# Patient Record
Sex: Female | Born: 1950 | Race: White | Hispanic: No | Smoking: Never smoker
Health system: Southern US, Community
[De-identification: ages and names within clinical notes are randomized; demographics above are authoritative.]

## PROBLEM LIST (undated history)

## (undated) DIAGNOSIS — M1990 Unspecified osteoarthritis, unspecified site: Secondary | ICD-10-CM

## (undated) DIAGNOSIS — I77811 Abdominal aortic ectasia: Secondary | ICD-10-CM

## (undated) DIAGNOSIS — C4490 Unspecified malignant neoplasm of skin, unspecified: Secondary | ICD-10-CM

## (undated) DIAGNOSIS — E785 Hyperlipidemia, unspecified: Secondary | ICD-10-CM

## (undated) DIAGNOSIS — D649 Anemia, unspecified: Secondary | ICD-10-CM

## (undated) DIAGNOSIS — D509 Iron deficiency anemia, unspecified: Secondary | ICD-10-CM

## (undated) DIAGNOSIS — E559 Vitamin D deficiency, unspecified: Secondary | ICD-10-CM

## (undated) DIAGNOSIS — I700 Atherosclerosis of aorta: Secondary | ICD-10-CM

## (undated) HISTORY — PX: ABDOMINAL HYSTERECTOMY: SHX81

## (undated) HISTORY — PX: RIGHT OOPHORECTOMY: SHX2359

## (undated) HISTORY — PX: TUBAL LIGATION: SHX77

## (undated) HISTORY — PX: EXPLORATORY LAPAROTOMY: SUR591

---

## 2005-05-17 ENCOUNTER — Ambulatory Visit: Payer: Self-pay | Admitting: Obstetrics and Gynecology

## 2005-06-01 ENCOUNTER — Ambulatory Visit: Payer: Self-pay | Admitting: Obstetrics and Gynecology

## 2005-07-18 ENCOUNTER — Ambulatory Visit: Payer: Self-pay | Admitting: Gynecologic Oncology

## 2005-08-08 ENCOUNTER — Ambulatory Visit: Payer: Self-pay | Admitting: Obstetrics and Gynecology

## 2005-08-17 ENCOUNTER — Ambulatory Visit: Payer: Self-pay | Admitting: Obstetrics and Gynecology

## 2005-11-12 ENCOUNTER — Ambulatory Visit: Payer: Self-pay | Admitting: Gastroenterology

## 2005-11-12 HISTORY — PX: COLONOSCOPY: SHX174

## 2006-01-22 ENCOUNTER — Ambulatory Visit: Payer: Self-pay | Admitting: Obstetrics and Gynecology

## 2007-05-15 ENCOUNTER — Ambulatory Visit: Payer: Self-pay | Admitting: Obstetrics and Gynecology

## 2008-08-24 ENCOUNTER — Ambulatory Visit: Payer: Self-pay | Admitting: Obstetrics and Gynecology

## 2009-09-01 ENCOUNTER — Ambulatory Visit: Payer: Self-pay | Admitting: Obstetrics and Gynecology

## 2013-01-27 ENCOUNTER — Ambulatory Visit: Payer: Self-pay | Admitting: Internal Medicine

## 2013-02-03 ENCOUNTER — Ambulatory Visit: Payer: Self-pay | Admitting: Internal Medicine

## 2014-01-28 ENCOUNTER — Ambulatory Visit: Payer: Self-pay | Admitting: Internal Medicine

## 2014-07-15 ENCOUNTER — Ambulatory Visit: Payer: Self-pay | Admitting: Internal Medicine

## 2015-01-07 ENCOUNTER — Other Ambulatory Visit: Payer: Self-pay | Admitting: Internal Medicine

## 2015-01-07 DIAGNOSIS — Z1231 Encounter for screening mammogram for malignant neoplasm of breast: Secondary | ICD-10-CM

## 2015-01-31 ENCOUNTER — Ambulatory Visit
Admission: RE | Admit: 2015-01-31 | Discharge: 2015-01-31 | Disposition: A | Discharge status: Home / Self Care | Payer: 59 | Source: Ambulatory Visit | Attending: Internal Medicine | Admitting: Internal Medicine

## 2015-01-31 DIAGNOSIS — Z1231 Encounter for screening mammogram for malignant neoplasm of breast: Secondary | ICD-10-CM | POA: Diagnosis not present

## 2015-01-31 HISTORY — DX: Unspecified malignant neoplasm of skin, unspecified: C44.90

## 2016-01-04 ENCOUNTER — Other Ambulatory Visit: Payer: Self-pay | Admitting: Internal Medicine

## 2016-01-04 DIAGNOSIS — Z1231 Encounter for screening mammogram for malignant neoplasm of breast: Secondary | ICD-10-CM

## 2016-02-01 ENCOUNTER — Ambulatory Visit: Payer: 59

## 2016-02-09 ENCOUNTER — Encounter: Payer: Self-pay | Admitting: *Deleted

## 2016-02-09 ENCOUNTER — Ambulatory Visit
Admission: RE | Admit: 2016-02-09 | Discharge: 2016-02-09 | Disposition: A | Discharge status: Home / Self Care | Payer: 59 | Source: Ambulatory Visit | Attending: Internal Medicine | Admitting: Internal Medicine

## 2016-02-09 DIAGNOSIS — Z1231 Encounter for screening mammogram for malignant neoplasm of breast: Secondary | ICD-10-CM | POA: Insufficient documentation

## 2016-02-10 ENCOUNTER — Ambulatory Visit: Payer: 59 | Admitting: Anesthesiology

## 2016-02-10 ENCOUNTER — Encounter: Payer: Self-pay | Admitting: *Deleted

## 2016-02-10 ENCOUNTER — Encounter
Admission: RE | Disposition: A | Discharge status: Home / Self Care | Payer: Self-pay | Source: Ambulatory Visit | Attending: Unknown Physician Specialty

## 2016-02-10 ENCOUNTER — Ambulatory Visit
Admission: RE | Admit: 2016-02-10 | Discharge: 2016-02-10 | Disposition: A | Discharge status: Home / Self Care | Payer: 59 | Source: Ambulatory Visit | Attending: Unknown Physician Specialty | Admitting: Unknown Physician Specialty

## 2016-02-10 DIAGNOSIS — Z1211 Encounter for screening for malignant neoplasm of colon: Secondary | ICD-10-CM | POA: Diagnosis present

## 2016-02-10 DIAGNOSIS — K5730 Diverticulosis of large intestine without perforation or abscess without bleeding: Secondary | ICD-10-CM | POA: Diagnosis not present

## 2016-02-10 DIAGNOSIS — K640 First degree hemorrhoids: Secondary | ICD-10-CM | POA: Insufficient documentation

## 2016-02-10 HISTORY — DX: Anemia, unspecified: D64.9

## 2016-02-10 HISTORY — PX: COLONOSCOPY WITH PROPOFOL: SHX5780

## 2016-02-10 HISTORY — DX: Vitamin D deficiency, unspecified: E55.9

## 2016-02-10 HISTORY — DX: Hyperlipidemia, unspecified: E78.5

## 2016-02-10 SURGERY — COLONOSCOPY WITH PROPOFOL
Anesthesia: General

## 2016-02-10 MED ORDER — SODIUM CHLORIDE 0.9 % IV SOLN
INTRAVENOUS | Status: DC
  Administered 2016-02-10: 1000 mL via INTRAVENOUS
  Administered 2016-02-10: 14:00:00 via INTRAVENOUS

## 2016-02-10 MED ORDER — SODIUM CHLORIDE 0.9 % IV SOLN: INTRAVENOUS | Status: DC

## 2016-02-10 MED ORDER — MIDAZOLAM HCL 2 MG/2ML IJ SOLN
INTRAMUSCULAR | Status: DC | PRN
  Administered 2016-02-10 (×2): 1 mg via INTRAVENOUS

## 2016-02-10 MED ORDER — PROPOFOL 500 MG/50ML IV EMUL
INTRAVENOUS | Status: DC | PRN
  Administered 2016-02-10: 120 ug/kg/min via INTRAVENOUS

## 2016-02-10 MED ORDER — FENTANYL CITRATE (PF) 100 MCG/2ML IJ SOLN
INTRAMUSCULAR | Status: DC | PRN
  Administered 2016-02-10: 5 ug via INTRAVENOUS
  Administered 2016-02-10: 50 ug via INTRAVENOUS
  Administered 2016-02-10: 25 ug via INTRAVENOUS

## 2016-02-10 NOTE — Anesthesia Procedure Notes (Signed)
Performed by: COOK-MARTIN, Jerris Keltz Pre-anesthesia Checklist: Patient identified, Emergency Drugs available, Suction available, Patient being monitored and Timeout performed Patient Re-evaluated:Patient Re-evaluated prior to inductionOxygen Delivery Method: Nasal cannula Preoxygenation: Pre-oxygenation with 100% oxygen Intubation Type: IV induction Placement Confirmation: positive ETCO2 and CO2 detector       

## 2016-02-10 NOTE — Anesthesia Preprocedure Evaluation (Addendum)
Anesthesia Evaluation  Patient identified by MRN, date of birth, ID band Patient awake    Reviewed: Allergy & Precautions, H&P , NPO status , Patient's Chart, lab work & pertinent test results, reviewed documented beta blocker date and time   History of Anesthesia Complications Negative for: history of anesthetic complications  Airway Mallampati: III  TM Distance: >3 FB Neck ROM: full    Dental no notable dental hx. (+) Poor Dentition   Pulmonary neg pulmonary ROS,    Pulmonary exam normal breath sounds clear to auscultation       Cardiovascular Exercise Tolerance: Good negative cardio ROS Normal cardiovascular exam Rhythm:regular Rate:Normal     Neuro/Psych negative neurological ROS  negative psych ROS   GI/Hepatic Neg liver ROS, GERD  ,  Endo/Other  negative endocrine ROS  Renal/GU negative Renal ROS  negative genitourinary   Musculoskeletal   Abdominal   Peds  Hematology  (+) Blood dyscrasia, anemia ,   Anesthesia Other Findings Past Medical History:   Skin cancer                                                  Anemia                                                       Vitamin D deficiency                                         Hyperlipidemia                                               Reproductive/Obstetrics negative OB ROS                            Anesthesia Physical Anesthesia Plan  ASA: II  Anesthesia Plan: General   Post-op Pain Management:    Induction:   Airway Management Planned:   Additional Equipment:   Intra-op Plan:   Post-operative Plan:   Informed Consent: I have reviewed the patients History and Physical, chart, labs and discussed the procedure including the risks, benefits and alternatives for the proposed anesthesia with the patient or authorized representative who has indicated his/her understanding and acceptance.   Dental Advisory  Given  Plan Discussed with: Anesthesiologist, CRNA and Surgeon  Anesthesia Plan Comments:        Anesthesia Quick Evaluation

## 2016-02-10 NOTE — Op Note (Signed)
[facility] Gastroenterology Patient Name: Gina Jordan Procedure Date: 02/10/2016 2:39 PM MRN: [id number] Account #: [id number] Date of Birth: Aug 19, 1951 Admit Type: Outpatient Age: 65 Room: [facility] ENDO ROOM 1 Gender: Female Note Status: Finalized Procedure:            Colonoscopy Indications:          Screening for colorectal malignant neoplasm Providers:            Manya Silvas, MD Referring MD:         Ramonita Lab, MD (Referring MD) Medicines:            Propofol per Anesthesia Complications:        No immediate complications. Procedure:            Pre-Anesthesia Assessment:                       - After reviewing the risks and benefits, the patient                        was deemed in satisfactory condition to undergo the                        procedure.                       After obtaining informed consent, the colonoscope was                        passed under direct vision. Throughout the procedure,                        the patient's blood pressure, pulse, and oxygen                        saturations were monitored continuously. The                        Colonoscope was introduced through the anus and                        advanced to the the cecum, identified by appendiceal                        orifice and ileocecal valve. The colonoscopy was                        performed without difficulty. The patient tolerated the                        procedure well. The quality of the bowel preparation                        was excellent. Findings:      A single small-mouthed diverticulum was found in the sigmoid colon.      Internal hemorrhoids were found during endoscopy. The hemorrhoids were       small and Grade I (internal hemorrhoids that do not prolapse).      The exam was otherwise without abnormality. Impression:           - Diverticulosis in the sigmoid colon.                       -  Internal hemorrhoids.                       - The  examination was otherwise normal.                       - No specimens collected. Recommendation:       - Repeat colonoscopy in 10 years for screening purposes. Manya Silvas, MD 02/10/2016 3:18:08 PM This report has been signed electronically. Number of Addenda: 0 Note Initiated On: 02/10/2016 2:39 PM Scope Withdrawal Time: 0 hours 12 minutes 49 seconds  Total Procedure Duration: 0 hours 27 minutes 45 seconds       [facility]

## 2016-02-10 NOTE — H&P (Signed)
   Primary Care Physician:  Adin Hector, MD Primary Gastroenterologist:  Dr. Vira Agar  Pre-Procedure History & Physical: HPI:  Gina Jordan is a 65 y.o. female is here for an colonoscopy.   Past Medical History  Diagnosis Date  . Skin cancer   . Anemia   . Vitamin D deficiency   . Hyperlipidemia     Past Surgical History  Procedure Laterality Date  . Abdominal hysterectomy    . Tubal ligation      Prior to Admission medications   Medication Sig Start Date End Date Taking? Authorizing Provider  ibuprofen (ADVIL,MOTRIN) 200 MG tablet Take 400 mg by mouth daily with breakfast.   Yes Historical Provider, MD  loratadine (CLARITIN) 10 MG tablet Take 10 mg by mouth daily.   Yes Historical Provider, MD  Cholecalciferol 1000 units tablet Take 1,000 Units by mouth daily. Reported on 02/10/2016    Historical Provider, MD    Allergies as of 02/01/2016  . (Not on File)    History reviewed. No pertinent family history.  Social History   Social History  . Marital Status: Divorced    Spouse Name: N/A  . Number of Children: N/A  . Years of Education: N/A   Occupational History  . Not on file.   Social History Main Topics  . Smoking status: Never Smoker   . Smokeless tobacco: Never Used  . Alcohol Use: 0.6 oz/week    1 Glasses of wine per week  . Drug Use: No  . Sexual Activity: Not on file   Other Topics Concern  . Not on file   Social History Narrative    Review of Systems: See HPI, otherwise negative ROS  Physical Exam: BP 136/79 mmHg  Pulse 79  Temp(Src) 97.9 F (36.6 C) (Tympanic)  Resp 16  Ht 5\' 6"  (1.676 m)  Wt 99.791 kg (220 lb)  BMI 35.53 kg/m2  SpO2 99% General:   Alert,  pleasant and cooperative in NAD Head:  Normocephalic and atraumatic. Neck:  Supple; no masses or thyromegaly. Lungs:  Clear throughout to auscultation.    Heart:  Regular rate and rhythm. Abdomen:  Soft, nontender and nondistended. Normal bowel sounds, without guarding, and  without rebound.   Neurologic:  Alert and  oriented x4;  grossly normal neurologically.  Impression/Plan: Gina Jordan is here for an colonoscopy to be performed for screening  Risks, benefits, limitations, and alternatives regarding  colonoscopy have been reviewed with the patient.  Questions have been answered.  All parties agreeable.   Gaylyn Cheers, MD  02/10/2016, 2:32 PM

## 2016-02-10 NOTE — Transfer of Care (Signed)
Immediate Anesthesia Transfer of Care Note  Patient: Gina Jordan  Procedure(s) Performed: Procedure(s): COLONOSCOPY WITH PROPOFOL (N/A)  Patient Location: PACU  Anesthesia Type:General  Level of Consciousness: awake and sedated  Airway & Oxygen Therapy: Patient Spontanous Breathing and Patient connected to nasal cannula oxygen  Post-op Assessment: Report given to RN and Post -op Vital signs reviewed and stable  Post vital signs: Reviewed and stable  Last Vitals:  Filed Vitals:   02/10/16 1333 02/10/16 1520  BP: 136/79 91/62  Pulse: 79 72  Temp: 36.6 C 35.7 C  Resp: 16 10    Last Pain: There were no vitals filed for this visit.       Complications: No apparent anesthesia complications

## 2016-02-13 NOTE — Anesthesia Postprocedure Evaluation (Signed)
Anesthesia Post Note  Patient: Gina Jordan  Procedure(s) Performed: Procedure(s) (LRB): COLONOSCOPY WITH PROPOFOL (N/A)  Patient location during evaluation: Endoscopy Anesthesia Type: General Level of consciousness: awake and alert Pain management: pain level controlled Vital Signs Assessment: post-procedure vital signs reviewed and stable Respiratory status: spontaneous breathing, nonlabored ventilation, respiratory function stable and patient connected to nasal cannula oxygen Cardiovascular status: blood pressure returned to baseline and stable Postop Assessment: no signs of nausea or vomiting Anesthetic complications: no    Last Vitals:  Filed Vitals:   02/10/16 1540 02/10/16 1550  BP: 112/75 116/88  Pulse: 67 65  Temp:    Resp: 14 15    Last Pain: There were no vitals filed for this visit.               Martha Clan

## 2016-02-14 ENCOUNTER — Encounter: Payer: Self-pay | Admitting: Unknown Physician Specialty

## 2017-01-09 ENCOUNTER — Other Ambulatory Visit: Payer: Self-pay | Admitting: Internal Medicine

## 2017-01-09 DIAGNOSIS — Z1231 Encounter for screening mammogram for malignant neoplasm of breast: Secondary | ICD-10-CM

## 2017-02-12 ENCOUNTER — Ambulatory Visit
Admission: RE | Admit: 2017-02-12 | Discharge: 2017-02-12 | Disposition: A | Discharge status: Home / Self Care | Payer: 59 | Source: Ambulatory Visit | Attending: Internal Medicine | Admitting: Internal Medicine

## 2017-02-12 DIAGNOSIS — Z1231 Encounter for screening mammogram for malignant neoplasm of breast: Secondary | ICD-10-CM | POA: Diagnosis not present

## 2017-04-30 DIAGNOSIS — N819 Female genital prolapse, unspecified: Secondary | ICD-10-CM | POA: Insufficient documentation

## 2017-05-10 HISTORY — PX: VAGINAL PROLAPSE REPAIR: SHX830

## 2018-04-02 ENCOUNTER — Other Ambulatory Visit: Payer: Self-pay | Admitting: Internal Medicine

## 2018-04-02 DIAGNOSIS — Z1231 Encounter for screening mammogram for malignant neoplasm of breast: Secondary | ICD-10-CM

## 2018-04-22 ENCOUNTER — Ambulatory Visit
Admission: RE | Admit: 2018-04-22 | Discharge: 2018-04-22 | Disposition: A | Discharge status: Home / Self Care | Payer: 59 | Source: Ambulatory Visit | Attending: Internal Medicine | Admitting: Internal Medicine

## 2018-04-22 DIAGNOSIS — Z1231 Encounter for screening mammogram for malignant neoplasm of breast: Secondary | ICD-10-CM | POA: Insufficient documentation

## 2019-04-20 ENCOUNTER — Other Ambulatory Visit: Payer: Self-pay | Admitting: Internal Medicine

## 2019-04-20 DIAGNOSIS — Z1231 Encounter for screening mammogram for malignant neoplasm of breast: Secondary | ICD-10-CM

## 2019-05-27 ENCOUNTER — Ambulatory Visit
Admission: RE | Admit: 2019-05-27 | Discharge: 2019-05-27 | Disposition: A | Discharge status: Home / Self Care | Payer: 59 | Source: Ambulatory Visit | Attending: Internal Medicine | Admitting: Internal Medicine

## 2019-05-27 DIAGNOSIS — Z1231 Encounter for screening mammogram for malignant neoplasm of breast: Secondary | ICD-10-CM | POA: Insufficient documentation

## 2019-06-03 ENCOUNTER — Other Ambulatory Visit: Payer: Self-pay | Admitting: Internal Medicine

## 2019-06-03 DIAGNOSIS — N6310 Unspecified lump in the right breast, unspecified quadrant: Secondary | ICD-10-CM

## 2019-06-03 DIAGNOSIS — R928 Other abnormal and inconclusive findings on diagnostic imaging of breast: Secondary | ICD-10-CM

## 2019-06-11 ENCOUNTER — Ambulatory Visit
Admission: RE | Admit: 2019-06-11 | Discharge: 2019-06-11 | Disposition: A | Discharge status: Home / Self Care | Payer: 59 | Source: Ambulatory Visit | Attending: Internal Medicine | Admitting: Internal Medicine

## 2019-06-11 DIAGNOSIS — R928 Other abnormal and inconclusive findings on diagnostic imaging of breast: Secondary | ICD-10-CM

## 2019-06-11 DIAGNOSIS — N6310 Unspecified lump in the right breast, unspecified quadrant: Secondary | ICD-10-CM

## 2019-06-15 ENCOUNTER — Other Ambulatory Visit: Payer: Self-pay | Admitting: Internal Medicine

## 2019-06-15 DIAGNOSIS — N6310 Unspecified lump in the right breast, unspecified quadrant: Secondary | ICD-10-CM

## 2019-06-15 DIAGNOSIS — R928 Other abnormal and inconclusive findings on diagnostic imaging of breast: Secondary | ICD-10-CM

## 2019-06-23 ENCOUNTER — Ambulatory Visit
Admission: RE | Admit: 2019-06-23 | Discharge: 2019-06-23 | Disposition: A | Discharge status: Home / Self Care | Payer: 59 | Source: Ambulatory Visit | Attending: Internal Medicine | Admitting: Internal Medicine

## 2019-06-23 DIAGNOSIS — N6310 Unspecified lump in the right breast, unspecified quadrant: Secondary | ICD-10-CM

## 2019-06-23 DIAGNOSIS — R928 Other abnormal and inconclusive findings on diagnostic imaging of breast: Secondary | ICD-10-CM | POA: Insufficient documentation

## 2019-06-23 HISTORY — PX: BREAST CYST ASPIRATION: SHX578

## 2019-10-23 ENCOUNTER — Other Ambulatory Visit: Payer: Self-pay | Admitting: Internal Medicine

## 2019-10-27 ENCOUNTER — Other Ambulatory Visit: Payer: Self-pay | Admitting: Internal Medicine

## 2019-10-27 DIAGNOSIS — R9389 Abnormal findings on diagnostic imaging of other specified body structures: Secondary | ICD-10-CM

## 2019-11-02 ENCOUNTER — Other Ambulatory Visit: Payer: Self-pay

## 2019-11-02 ENCOUNTER — Ambulatory Visit
Admission: RE | Admit: 2019-11-02 | Discharge: 2019-11-02 | Disposition: A | Discharge status: Home / Self Care | Payer: 59 | Source: Ambulatory Visit | Attending: Internal Medicine | Admitting: Internal Medicine

## 2019-11-02 DIAGNOSIS — R9389 Abnormal findings on diagnostic imaging of other specified body structures: Secondary | ICD-10-CM | POA: Diagnosis not present

## 2019-11-04 DIAGNOSIS — I77811 Abdominal aortic ectasia: Secondary | ICD-10-CM | POA: Insufficient documentation

## 2019-12-12 ENCOUNTER — Ambulatory Visit: Payer: 59 | Attending: Internal Medicine

## 2019-12-12 DIAGNOSIS — Z23 Encounter for immunization: Secondary | ICD-10-CM

## 2019-12-12 NOTE — Progress Notes (Signed)
   Covid-19 Vaccination Clinic  Name:  Gina Jordan    MRN: [id number] DOB: 1950/10/17  12/12/2019  Ms. Gina Jordan was observed post Covid-19 immunization for 15 minutes without incident. She was provided with Vaccine Information Sheet and instruction to access the V-Safe system.   Ms. Gina Jordan was instructed to call 911 with any severe reactions post vaccine: Marland Kitchen Difficulty breathing  . Swelling of face and throat  . A fast heartbeat  . A bad rash all over body  . Dizziness and weakness   Immunizations Administered    Name Date Dose VIS Date Route   Pfizer COVID-19 Vaccine 12/12/2019  2:20 PM 0.3 mL 09/04/2019 Intramuscular   Manufacturer: [place]   Lot: [id number]   [place]: [id number]

## 2020-01-05 ENCOUNTER — Ambulatory Visit
Admission: RE | Admit: 2020-01-05 | Discharge: 2020-01-05 | Disposition: A | Discharge status: Home / Self Care | Payer: 59 | Source: Ambulatory Visit | Attending: Physician Assistant | Admitting: Physician Assistant

## 2020-01-05 ENCOUNTER — Other Ambulatory Visit: Payer: Self-pay

## 2020-01-05 ENCOUNTER — Other Ambulatory Visit: Payer: Self-pay | Admitting: Physician Assistant

## 2020-01-05 DIAGNOSIS — M7989 Other specified soft tissue disorders: Secondary | ICD-10-CM | POA: Insufficient documentation

## 2020-01-05 DIAGNOSIS — M79605 Pain in left leg: Secondary | ICD-10-CM | POA: Insufficient documentation

## 2020-01-06 ENCOUNTER — Ambulatory Visit: Payer: 59 | Attending: Internal Medicine

## 2020-01-06 DIAGNOSIS — Z23 Encounter for immunization: Secondary | ICD-10-CM

## 2020-01-06 NOTE — Progress Notes (Signed)
   Covid-19 Vaccination Clinic  Name:  Gina Jordan    MRN: [id number] DOB: 04-05-1951  01/06/2020  Gina Jordan was observed post Covid-19 immunization for 15 minutes without incident. She was provided with Vaccine Information Sheet and instruction to access the V-Safe system.   Gina Jordan was instructed to call 911 with any severe reactions post vaccine: Marland Kitchen Difficulty breathing  . Swelling of face and throat  . A fast heartbeat  . A bad rash all over body  . Dizziness and weakness   Immunizations Administered    Name Date Dose VIS Date Route   Pfizer COVID-19 Vaccine 01/06/2020  2:56 PM 0.3 mL 09/04/2019 Intramuscular   Manufacturer: [place]   Lot: [id number]   [place]: [id number]

## 2020-04-04 IMAGING — MG US ASPIRATION RIGHT BREAST
1 series · 8 of 8 positions shown · non-contrast
Comparison: Previous exams.

CLINICAL DATA: Patient presents for attempted aspiration and
possible biopsy of a 6 mm mass over the 10 o'clock position of the
right breast 9 cm from the nipple.

EXAM:
ULTRASOUND GUIDED RIGHT BREAST CYST ASPIRATION

[Series 1: MG view · 0.08mm/px · 8 of 20 slices shown]
[im 1/20]
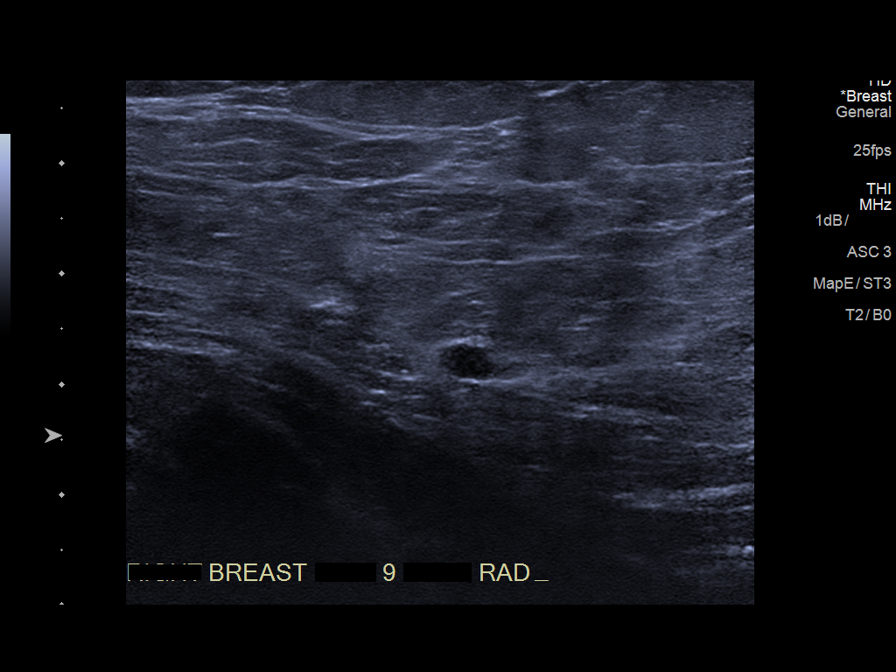
[im 3/20]
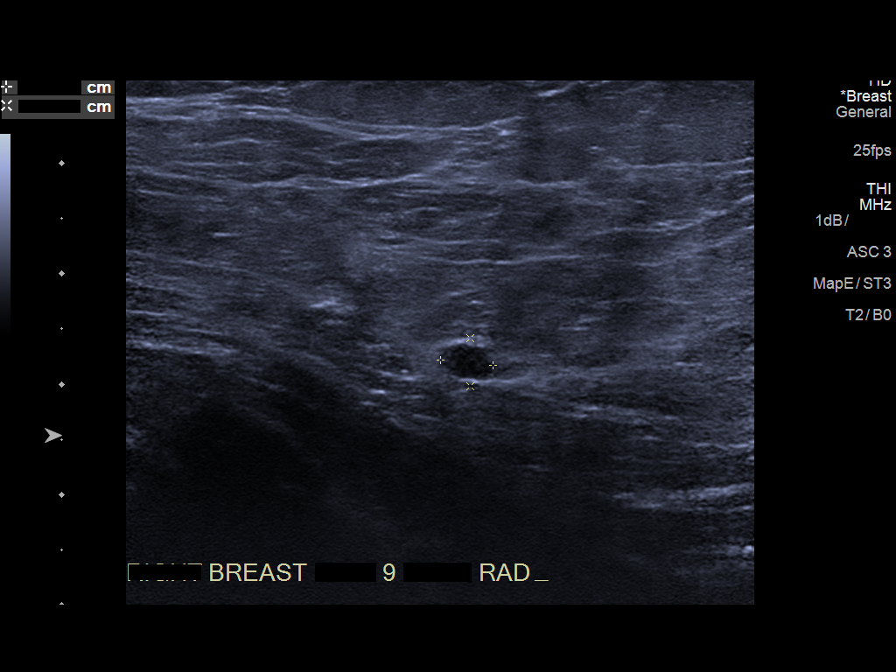
[im 6/20]
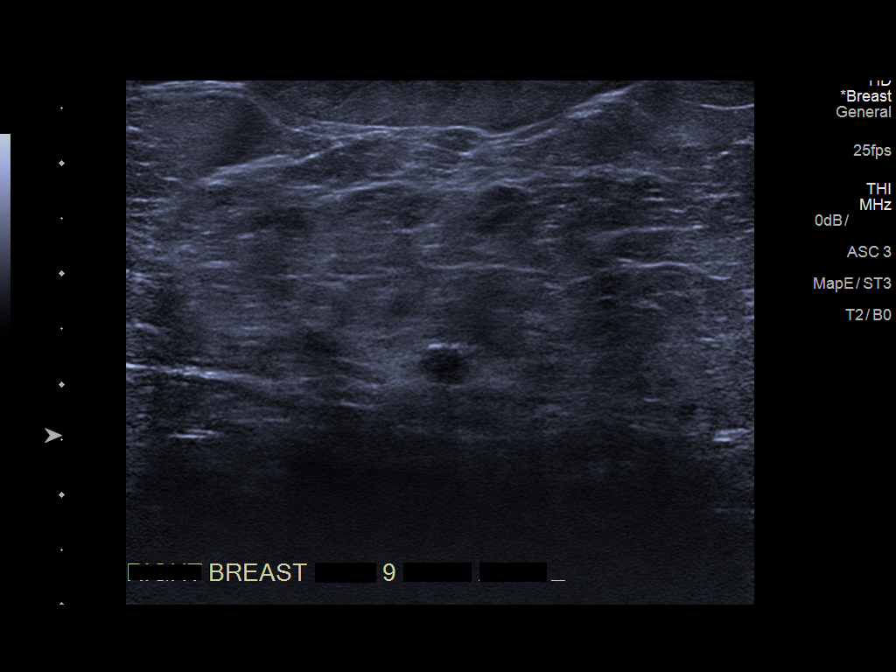
[im 9/20]
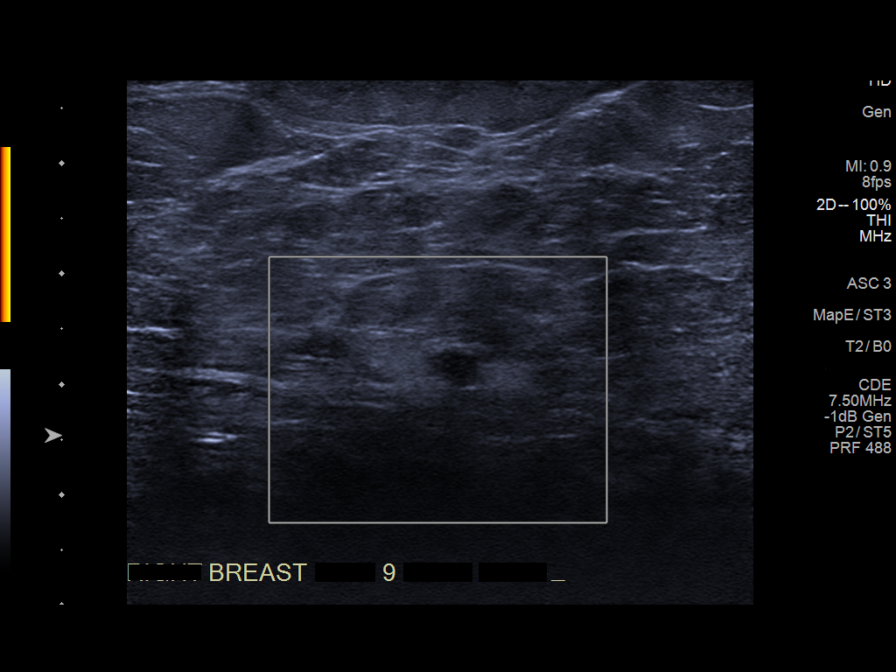
[im 11/20]
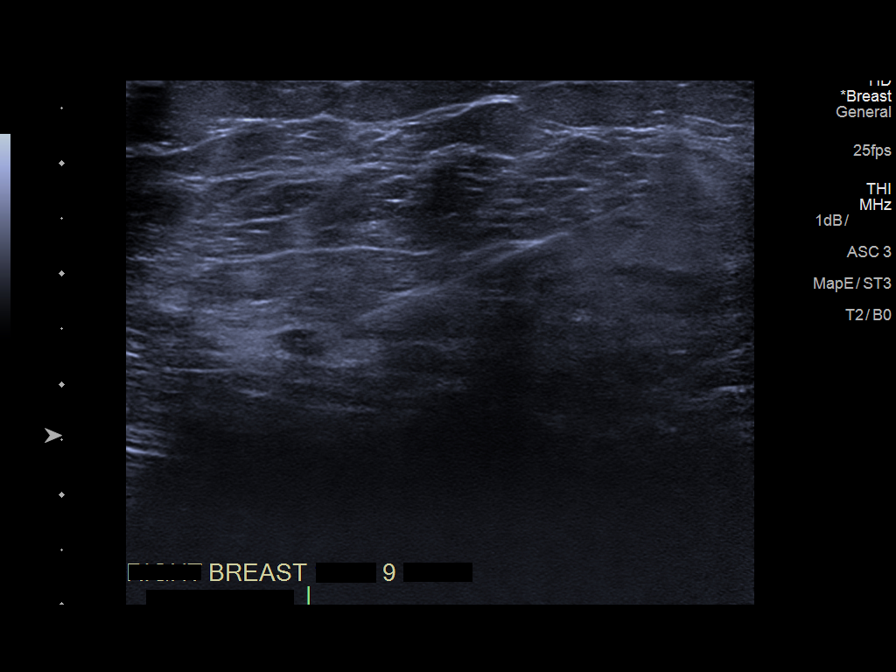
[im 14/20]
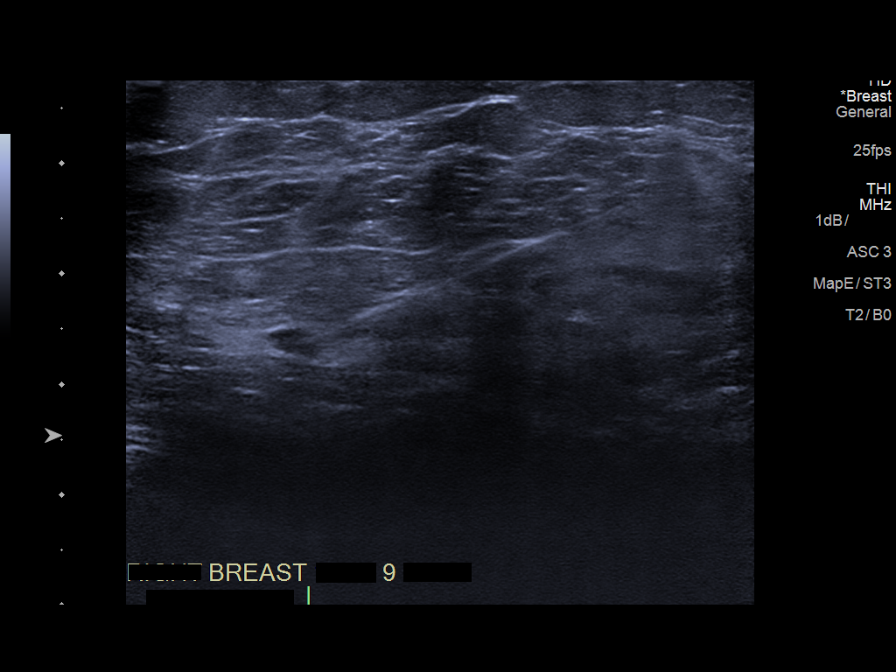
[im 17/20]
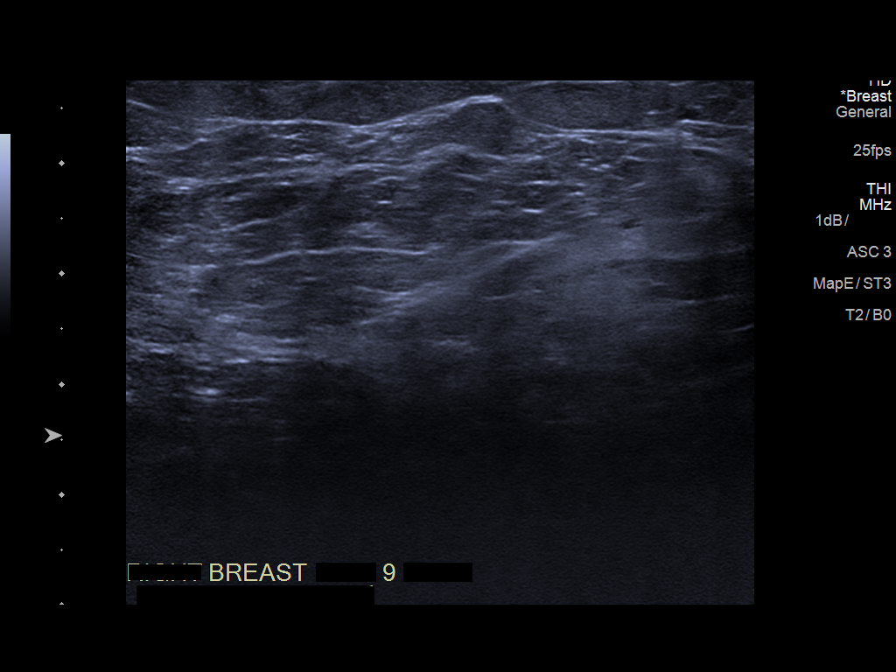
[im 20/20]
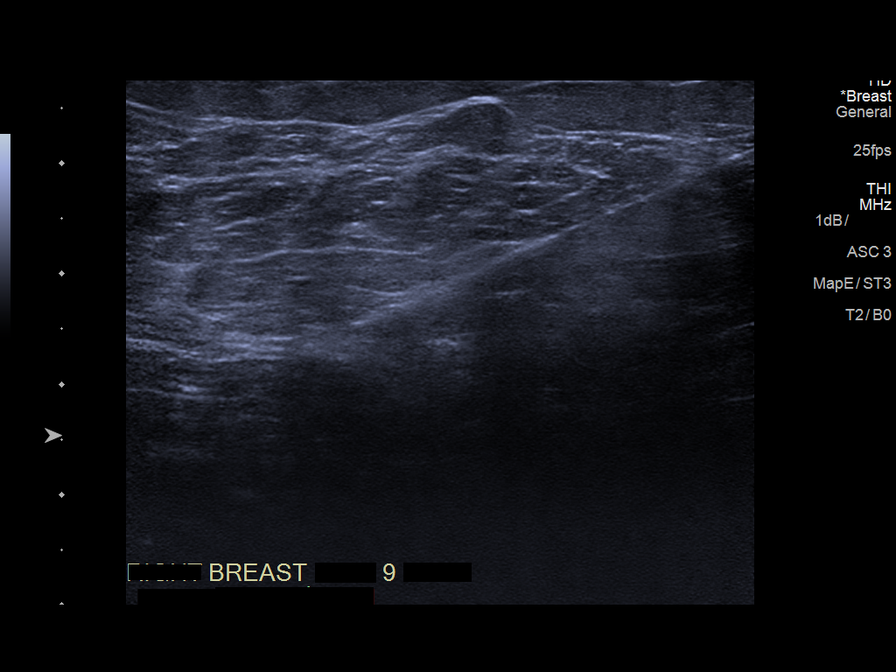

[8 of 8 positions shown; findings below may reference images not displayed]

PROCEDURE:
Using sterile technique, 1% lidocaine, under direct ultrasound
visualization, needle aspiration of the targeted mass at the 10
o'clock position was performed using a 20 gauge spinal needle. The
mass collapsed completely as a scant amount of dark serous fluid was
removed.
IMPRESSION: Ultrasound-guided aspiration of right breast cyst no apparent
complications.

RECOMMENDATIONS:
Recommend follow-up bilateral screening mammography in 1 year.

## 2020-10-20 ENCOUNTER — Other Ambulatory Visit: Payer: Self-pay | Admitting: Internal Medicine

## 2020-10-20 DIAGNOSIS — Z1231 Encounter for screening mammogram for malignant neoplasm of breast: Secondary | ICD-10-CM

## 2020-11-07 ENCOUNTER — Other Ambulatory Visit: Payer: Self-pay

## 2020-11-07 ENCOUNTER — Ambulatory Visit
Admission: RE | Admit: 2020-11-07 | Discharge: 2020-11-07 | Disposition: A | Discharge status: Home / Self Care | Payer: 59 | Source: Ambulatory Visit | Attending: Internal Medicine | Admitting: Internal Medicine

## 2020-11-07 DIAGNOSIS — Z1231 Encounter for screening mammogram for malignant neoplasm of breast: Secondary | ICD-10-CM | POA: Insufficient documentation

## 2021-08-20 IMAGING — MG MM DIGITAL SCREENING BILAT W/ TOMO AND CAD
6 of 12 series · 6 of 36 positions shown · non-contrast
Comparison: Previous exam(s).

CLINICAL DATA: Screening.

EXAM:
DIGITAL SCREENING BILATERAL MAMMOGRAM WITH TOMOSYNTHESIS AND CAD
TECHNIQUE: Bilateral screening digital craniocaudal and mediolateral oblique
mammograms were obtained. Bilateral screening digital breast
tomosynthesis was performed. The images were evaluated with
computer-aided detection.

[R MLO synth-2D (1 of 2)]
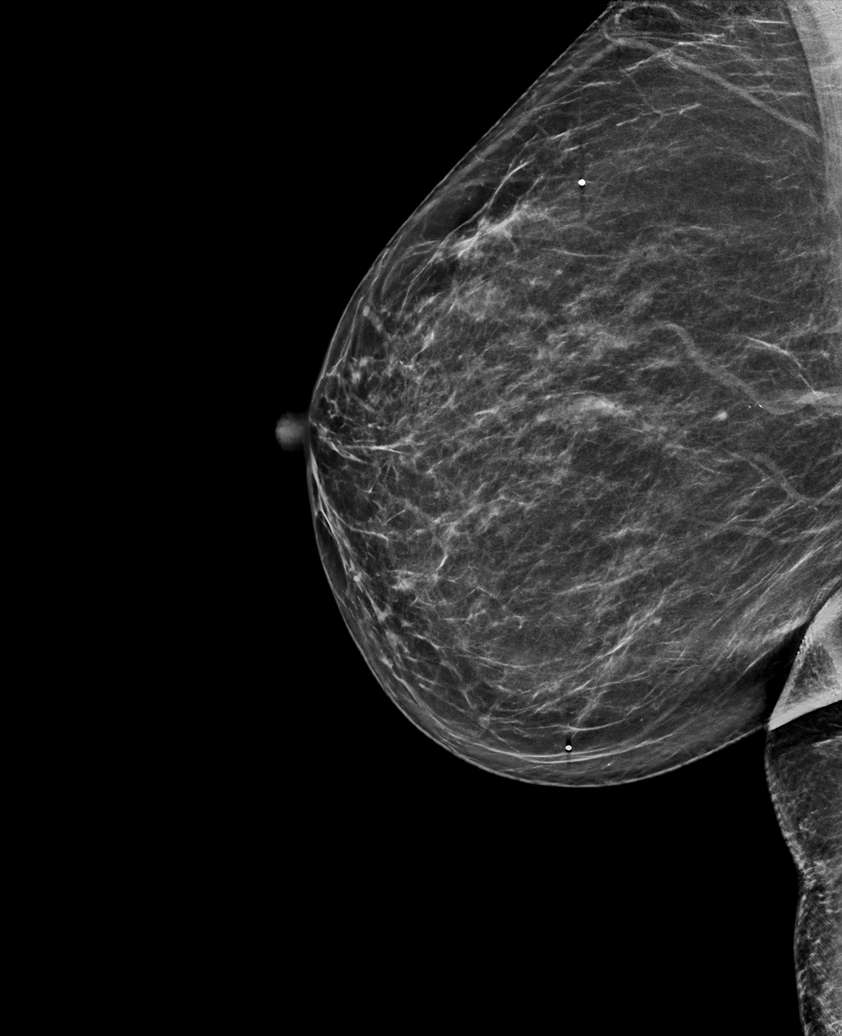

[L MLO synth-2D (1 of 2)]
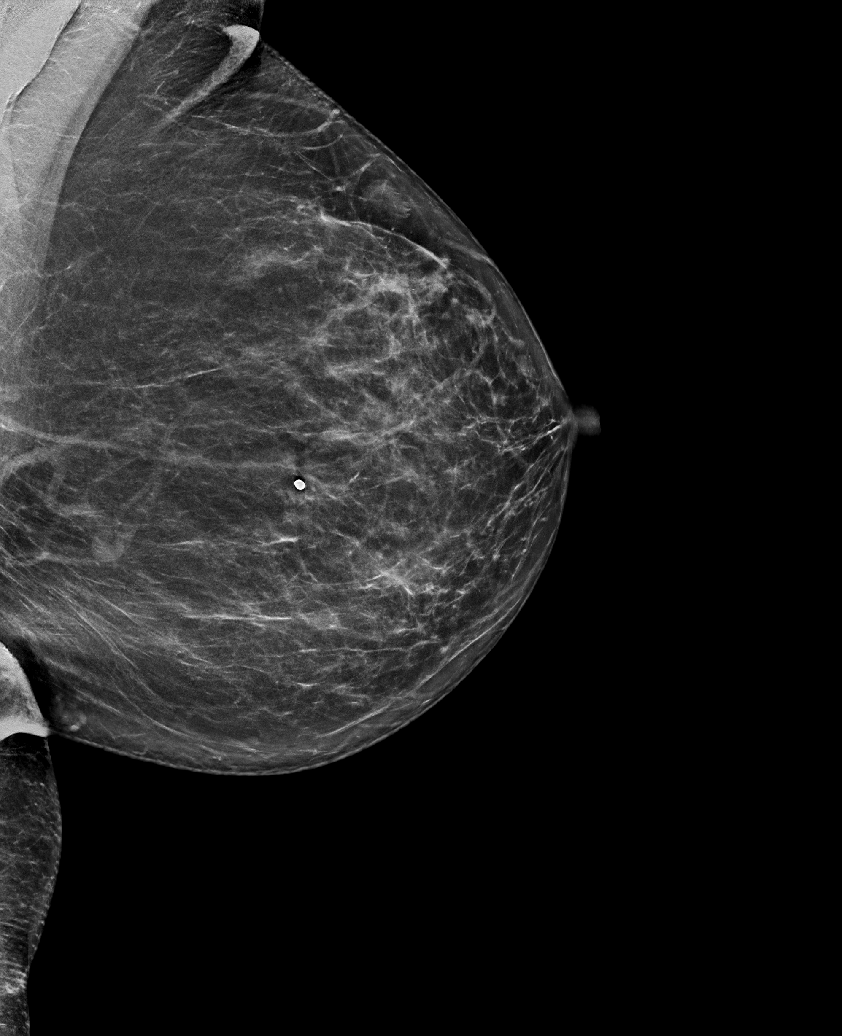

[L MLO synth-2D (2 of 2)]
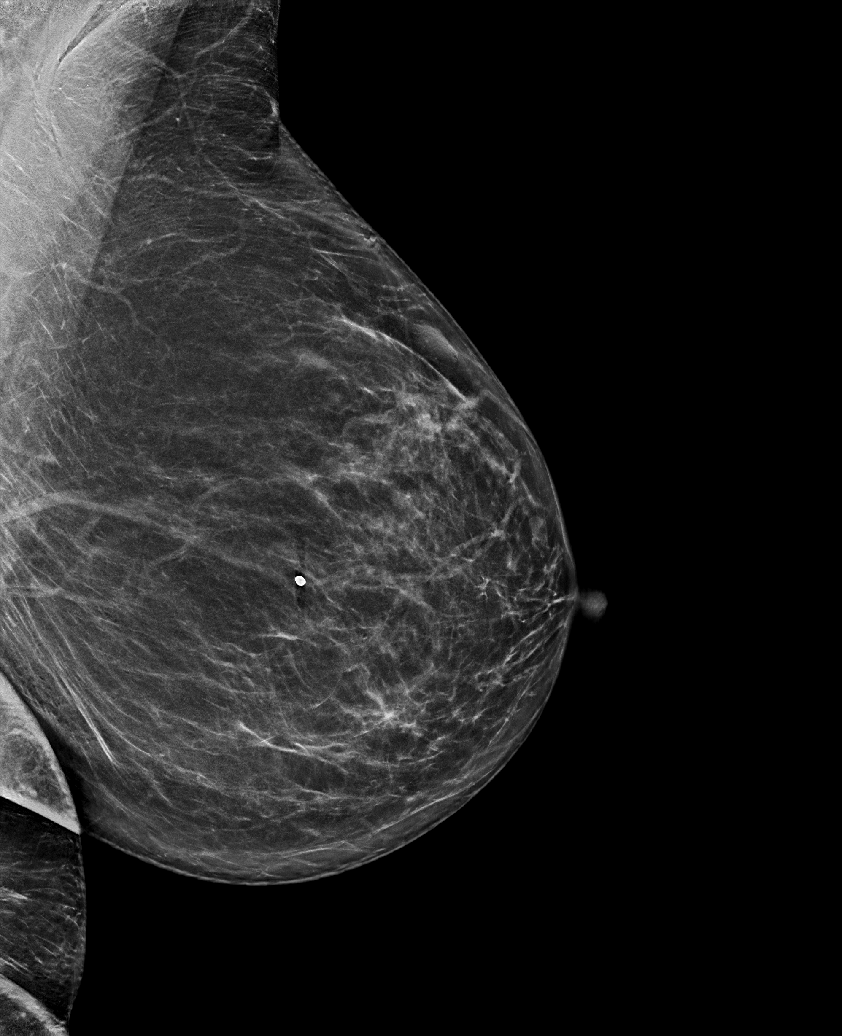

[R CC synth-2D]
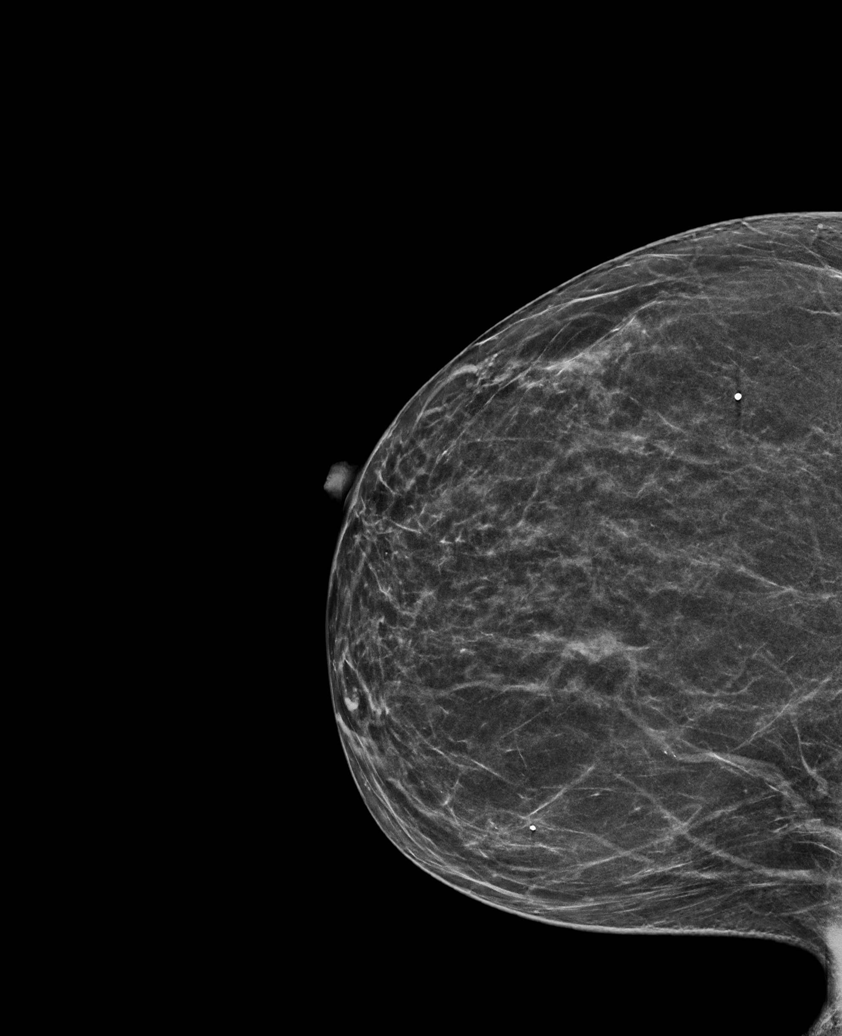

[R MLO synth-2D (2 of 2)]
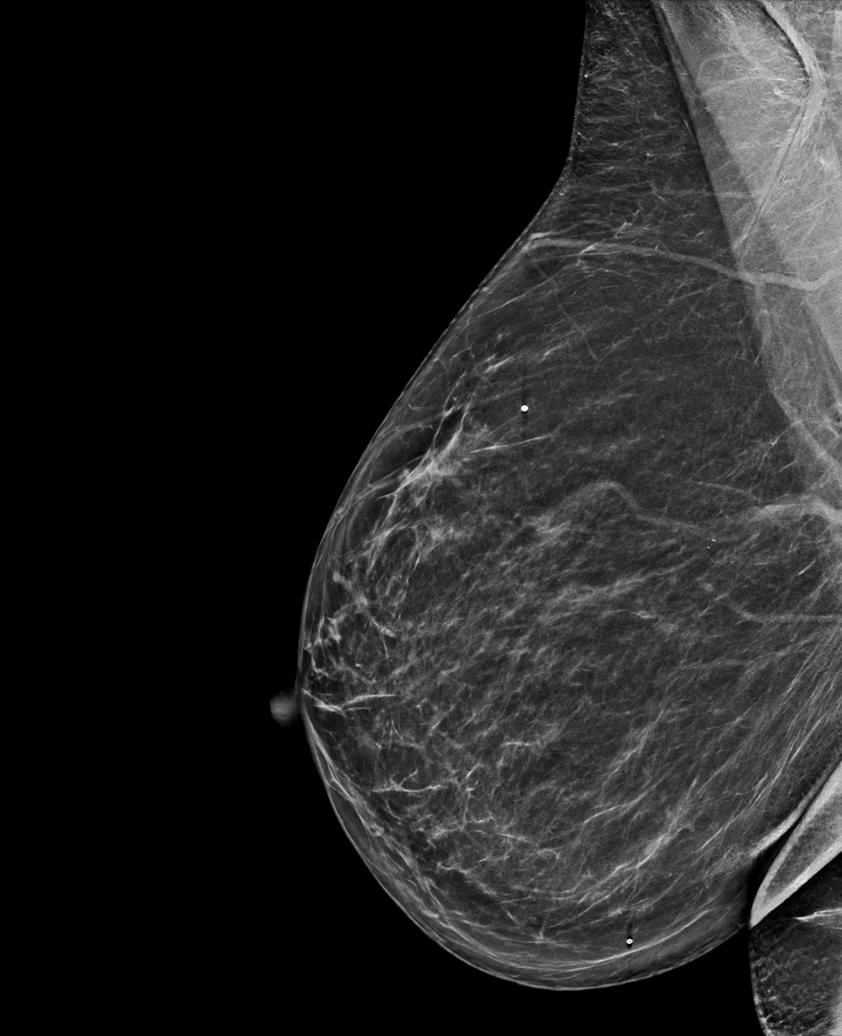

[L CC synth-2D]
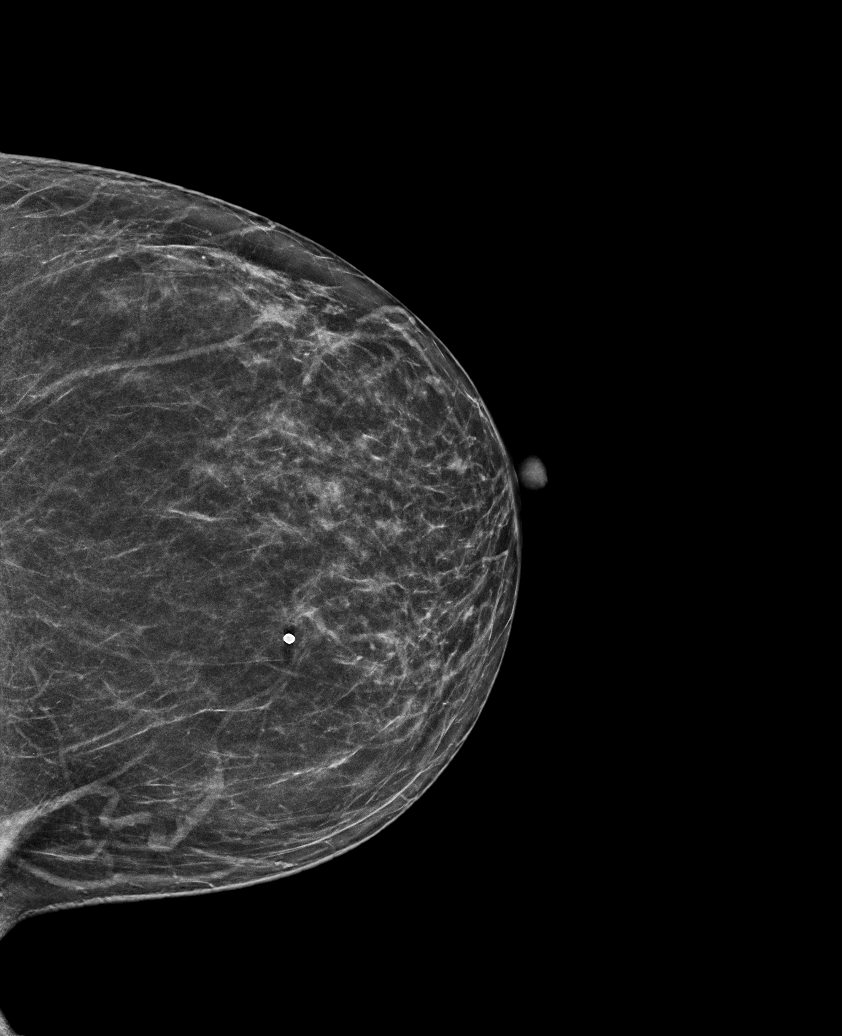

[6 of 36 positions shown; findings below may reference images not displayed]

ACR Breast Density Category b: There are scattered areas of
fibroglandular density.
FINDINGS: There are no findings suspicious for malignancy.
IMPRESSION: No mammographic evidence of malignancy. A result letter of this
screening mammogram will be mailed directly to the patient.

RECOMMENDATION:
Screening mammogram in one year. (Code:51-O-LD2)

BI-RADS CATEGORY  1: Negative.

## 2021-09-24 HISTORY — PX: CATARACT EXTRACTION W/ INTRAOCULAR LENS  IMPLANT, BILATERAL: SHX1307

## 2021-10-16 NOTE — Discharge Instructions (Signed)
Instructions after Total Hip Replacement     Gina Jordan, Jr., M.D.     Dept. of Orthopaedics & Sports Medicine  [facility]  [address]  [place], [place]  [address]  Phone: [phone]   Fax: [phone]    DIET: . Drink plenty of non-alcoholic fluids. . Resume your normal diet. Include foods high in fiber.  ACTIVITY:  . You may use crutches or a walker with weight-bearing as tolerated, unless instructed otherwise. . You may be weaned off of the walker or crutches by your Physical Therapist.  . Do NOT reach below the level of your knees or cross your legs until allowed.    . Continue doing gentle exercises. Exercising will reduce the pain and swelling, increase motion, and prevent muscle weakness.   . Please continue to use the TED compression stockings for 6 weeks. You may remove the stockings at night, but should reapply them in the morning. . Do not drive or operate any equipment until instructed.  WOUND CARE:  . Continue to use ice packs periodically to reduce pain and swelling. . Keep the incision clean and dry. . You may bathe or shower after the staples are removed at the first office visit following surgery.  MEDICATIONS: . You may resume your regular medications. . Please take the pain medication as prescribed on the medication. . Do not take pain medication on an empty stomach. . You have been given a prescription for a blood thinner to prevent blood clots. Please take the medication as instructed. (NOTE: After completing a 2 week course of Lovenox, take one Enteric-coated aspirin once a day.) . Pain medications and iron supplements can cause constipation. Use a stool softener (Senokot or Colace) on a daily basis and a laxative (dulcolax or miralax) as needed. . Do not drive or drink alcoholic beverages when taking pain medications.  CALL THE OFFICE FOR: . Temperature above 101 degrees . Excessive bleeding or drainage on the dressing. . Excessive  swelling, coldness, or paleness of the toes. . Persistent nausea and vomiting.  FOLLOW-UP:  . You should have an appointment to return to the office in 6 weeks after surgery. . Arrangements have been made for continuation of Physical Therapy (either home therapy or outpatient therapy).     [facility] Department Directory         [email or web address]       [email or web address]          Cardiology  Appointments: [place] - [phone] [place] - [phone]  Endocrinology  Appointments: [place] - [phone] [place] - [phone]  Gastroenterology  Appointments: [place] - [phone] [place] - [phone]        General Surgery   Appointments: [place] - [phone]  Internal Medicine/Family Medicine  Appointments: [place] - [phone] [place] - [phone] [place] - [phone]  Metabolic and Weigh Loss Surgery  Appointments: [place] - [phone]        Neurology  Appointments: [place] - [phone] [place] - [phone]  Neurosurgery  Appointments: [place] - [phone]  Obstetrics & Gynecology  Appointments: [place] - [phone] [place] - [phone]        Pediatrics  Appointments: [place] - [phone] [place] - [phone]  Physiatry  Appointments: [place] -[phone]  Physical Therapy  Appointments: [place] - [phone] [place] - [phone]        Podiatry  Appointments: [place] - [phone] [place] - [phone]  Pulmonology  Appointments: [place] - [phone]  Rheumatology  Appointments: [place] - [phone]         Location: [facility]  [address] [place], [place]  [address]  [place] Location: [facility] [address] [place], [place]  [address]  [place] Location: [facility] [address] [place], [place]  [address]    

## 2021-10-19 ENCOUNTER — Encounter
Admission: RE | Admit: 2021-10-19 | Discharge: 2021-10-19 | Disposition: A | Discharge status: Home / Self Care | Payer: 59 | Source: Ambulatory Visit | Attending: Orthopedic Surgery | Admitting: Orthopedic Surgery

## 2021-10-19 ENCOUNTER — Other Ambulatory Visit: Payer: Self-pay

## 2021-10-19 VITALS — BP 133/83 | HR 83 | Temp 98.1°F | Resp 14 | Ht 61.0 in | Wt 200.0 lb

## 2021-10-19 DIAGNOSIS — Z01818 Encounter for other preprocedural examination: Secondary | ICD-10-CM | POA: Diagnosis not present

## 2021-10-19 DIAGNOSIS — M1611 Unilateral primary osteoarthritis, right hip: Secondary | ICD-10-CM | POA: Insufficient documentation

## 2021-10-19 DIAGNOSIS — Z01812 Encounter for preprocedural laboratory examination: Secondary | ICD-10-CM

## 2021-10-19 HISTORY — DX: Iron deficiency anemia, unspecified: D50.9

## 2021-10-19 HISTORY — DX: Abdominal aortic ectasia: I77.811

## 2021-10-19 HISTORY — DX: Atherosclerosis of aorta: I70.0

## 2021-10-19 HISTORY — DX: Unspecified osteoarthritis, unspecified site: M19.90

## 2021-10-19 LAB — TYPE AND SCREEN
ABO/RH(D): O POS
Antibody Screen: NEGATIVE

## 2021-10-19 LAB — SURGICAL PCR SCREEN
MRSA, PCR: NEGATIVE
Staphylococcus aureus: NEGATIVE

## 2021-10-19 LAB — C-REACTIVE PROTEIN: CRP: 0.6 mg/dL (ref <1.0)

## 2021-10-19 LAB — SEDIMENTATION RATE: Sed Rate: 9 mm/hr (ref 0–30)

## 2021-10-19 NOTE — Patient Instructions (Addendum)
Your procedure is scheduled on: Monday, February 6 Report to the Registration Desk on the 1st floor of the [facility]. To find out your arrival time, please call [phone] between 1PM - 3PM on: Friday, February 3  REMEMBER: Instructions that are not followed completely may result in serious medical risk, up to and including death; or upon the discretion of your surgeon and anesthesiologist your surgery may need to be rescheduled.  Do not eat food after midnight the night before surgery.  No gum chewing, lozengers or hard candies.  You may however, drink CLEAR liquids up to 2 hours before you are scheduled to arrive for your surgery. Do not drink anything within 2 hours of your scheduled arrival time.  Clear liquids include: - water  - apple juice without pulp - gatorade (not RED, PURPLE, OR BLUE) - black coffee or tea (Do NOT add milk or creamers to the coffee or tea) Do NOT drink anything that is not on this list.  In addition, your doctor has ordered for you to drink the provided  Ensure Pre-Surgery Clear Carbohydrate Drink  Drinking this carbohydrate drink up to two hours before surgery helps to reduce insulin resistance and improve patient outcomes. Please complete drinking 2 hours prior to scheduled arrival time.  TAKE THESE MEDICATIONS THE MORNING OF SURGERY WITH A SIP OF WATER:  Atorvastatin  You may take your eye drops also.  One week prior to surgery: starting January 30 Stop Anti-inflammatories (NSAIDS) such as Advil, Aleve, Ibuprofen, Motrin, Naproxen, Naprosyn and Aspirin based products such as Excedrin, Goodys Powder, BC Powder. Stop ANY OVER THE COUNTER supplements until after surgery. You may however, continue to take Tylenol if needed for pain up until the day of surgery.  No Alcohol for 24 hours before or after surgery.  No Smoking including e-cigarettes for 24 hours prior to surgery.  No chewable tobacco products for at least 6 hours prior to surgery.   No nicotine patches on the day of surgery.  Do not use any "recreational" drugs for at least a week prior to your surgery.  Please be advised that the combination of cocaine and anesthesia may have negative outcomes, up to and including death. If you test positive for cocaine, your surgery will be cancelled.  On the morning of surgery brush your teeth with toothpaste and water, you may rinse your mouth with mouthwash if you wish. Do not swallow any toothpaste or mouthwash.  Use CHG Soap as directed on instruction sheet.  Do not wear jewelry, make-up, hairpins, clips or nail polish.  Do not wear lotions, powders, or perfumes.   Do not shave body from the neck down 48 hours prior to surgery just in case you cut yourself which could leave a site for infection.  Also, freshly shaved skin may become irritated if using the CHG soap.  Contact lenses, hearing aids and dentures may not be worn into surgery.  Do not bring valuables to the hospital. [facility] is not responsible for any missing/lost belongings or valuables.   Notify your doctor if there is any change in your medical condition (cold, fever, infection).  Wear comfortable clothing (specific to your surgery type) to the hospital.  After surgery, you can help prevent lung complications by doing breathing exercises.  Take deep breaths and cough every 1-2 hours. Your doctor may order a device called an Incentive Spirometer to help you take deep breaths.  If you are being admitted to the hospital overnight, leave your  suitcase in the car. After surgery it may be brought to your room.  If you are being discharged the day of surgery, you will not be allowed to drive home. You will need a responsible adult (18 years or older) to drive you home and stay with you that night.   If you are taking public transportation, you will need to have a responsible adult (18 years or older) with you. Please confirm with your physician that it is  acceptable to use public transportation.   Please call the [facility] at [phone] if you have any questions about these instructions.  Surgery Visitation Policy:  Patients undergoing a surgery or procedure may have one family member or support person with them as long as that person is not COVID-19 positive or experiencing its symptoms.  That person may remain in the waiting area during the procedure and may rotate out with other people.  Inpatient Visitation:    Visiting hours are 7 a.m. to 8 p.m. Up to two visitors ages 16+ are allowed at one time in a patient room. The visitors may rotate out with other people during the day. Visitors must check out when they leave, or other visitors will not be allowed. One designated support person may remain overnight. The visitor must pass COVID-19 screenings, use hand sanitizer when entering and exiting the patients room and wear a mask at all times, including in the patients room. Patients must also wear a mask when staff or their visitor are in the room. Masking is required regardless of vaccination status.

## 2021-10-26 ENCOUNTER — Other Ambulatory Visit
Admission: RE | Admit: 2021-10-26 | Discharge: 2021-10-26 | Disposition: A | Discharge status: Home / Self Care | Payer: 59 | Source: Ambulatory Visit | Attending: Orthopedic Surgery | Admitting: Orthopedic Surgery

## 2021-10-26 ENCOUNTER — Other Ambulatory Visit: Payer: Self-pay

## 2021-10-26 DIAGNOSIS — Z20822 Contact with and (suspected) exposure to covid-19: Secondary | ICD-10-CM | POA: Diagnosis not present

## 2021-10-26 DIAGNOSIS — Z01812 Encounter for preprocedural laboratory examination: Secondary | ICD-10-CM | POA: Diagnosis present

## 2021-10-26 LAB — SARS CORONAVIRUS 2 (TAT 6-24 HRS): SARS Coronavirus 2: NEGATIVE

## 2021-10-29 NOTE — H&P (Signed)
ORTHOPAEDIC HISTORY & PHYSICAL Gina Jordan, [place] - 10/20/2021 8:45 AM EST Formatting of this note is different from the original. [place] [facility] Chief Complaint:   Chief Complaint  Patient presents with   Hip Pain  H & P RIGHT HIP   History of Present Illness:   Gina Jordan is a 71 y.o. female that presents to clinic today for her preoperative history and evaluation. Patient presents unaccompanied. The patient is scheduled to undergo a right total hip arthroplasty on 10/30/21 by Dr. Marry Guan. Her pain began approximately 2 years ago. The pain is located in the right hip and groin. She describes her pain as worse when rising from a seated position as well as going up and down stairs. She reports associated loss of hip range of motion. She denies associated numbness or tingling.   The patient's symptoms have progressed to the point that they decrease her quality of life. The patient has previously undergone conservative treatment including NSAIDS and activity modification without adequate control of her symptoms.  Denies history of blood clots, denies significant cardiac history, and denies history of lumbar surgery. Her daughter will be available after surgery to help.   Past Medical, Surgical, Family, Social History, Allergies, Medications:   Past Medical History:  Past Medical History:  Diagnosis Date   Anemia   Aortic ectasia, abdominal (CMS-HCC) 11/04/2019  By ultrasound 2/21. Repeat ultrasound is recommended 2026   History of motion sickness   Hyperlipidemia   Ovarian tumor of borderline malignancy, right 2006   Vitamin D deficiency   Past Surgical History:  Past Surgical History:  Procedure Laterality Date   COLONOSCOPY 11/12/2005  Normal Colon   COLONOSCOPY 02/10/2016  Int Hemorrhoids, Diverticulosis: CBF 01/2026   REPAIR VAGINAL PROLAPSE W/WO SACROSPINOUS LIGAMENT SUSPENSION N/A 05/10/2017  Procedure: COLPOPEXY, VAGINAL;  EXTRA-PERITONEAL APPROACH (SACROSPINOUS, ILIOCOCCYGEUS); Surgeon: Dwana Curd, MD; Location: [facility] OR; Service: Gynecology; Laterality: N/A;   CYSTOURETHROSCOPY 05/10/2017  Procedure: CYSTOURETHROSCOPY (SEPARATE PROCEDURE); Surgeon: Dwana Curd, MD; Location: [facility] OR; Service: Gynecology;;   COLPORRHAPHY FOR REPAIR CYSTOCELE ANTERIOR 05/10/2017  Procedure: ANTERIOR COLPORRHAPHY, REPAIR OF CYSTOCELE WITH OR WITHOUT REPAIR OF URETHROCELE, INCLUDING CYSTOURETHROSCOPY, WHEN PERFORMED; Surgeon: Dwana Curd, MD; Location: [facility] OR; Service: Gynecology;;   CATARACT EXTRACTION   EXPLORATORY LAPAROTOMY  tubal pregnancy   HYSTERECTOMY   OTHER SURGERY  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 30 capsule 1   cholecalciferol (VITAMIN D3) 1000 unit tablet Take 1,000 Units by mouth once daily   ferrous sulfate (IRON ORAL) Take 325 mg by mouth every other day Takes every other day   FUROsemide (LASIX) 20 MG tablet TAKE 1 TABLET (20 MG TOTAL) BY MOUTH ONCE DAILY AS NEEDED FOR EDEMA 90 tablet 3   gatifloxacin (ZYMAXID) 0.5 % ophthalmic solution INSTILL 1 DROP INTO RIGHT EYE 4 TIMES A DAY AS DIRECTED   prednisoLONE acetate (PRED FORTE) 1 % ophthalmic suspension INSTILL 1 DROP INTO LEFT EYE FOUR TIMES A DAY AS DIRECTED   PROLENSA 0.07 % ophthalmic solution INSTILL 1 DROP INTO RIGHT EYE  EVERY EVENING AS DIRECTED   No current facility-administered medications for this visit.   Allergies: No Known Allergies  Social History:  Social History   Socioeconomic History   Marital status: Single    Number of children: 2   Years of education: 12   Highest education level: High school graduate  Occupational History   Occupation: Full-time - Psychologist, educational- [employer]  Tobacco Use   Smoking status: Never   Smokeless tobacco: Never  Vaping Use   Vaping Use: Never used  Substance and Sexual Activity   Alcohol use: Yes  Alcohol/week: 1.0 standard drink  Types: 1 Standard drinks or equivalent per week  Comment: 1 x week   Drug use: No   Sexual activity: Defer  Partners: Male   Family History:  Family History  Problem Relation Age of Onset   Diabetes Mother   Stomach cancer Maternal Grandmother   Anesthesia problems Neg Hx   Malignant hyperthermia Neg Hx   Review of Systems:   A 10+ ROS was performed, reviewed, and the pertinent orthopaedic findings are documented in the HPI.   Physical Examination:   BP (!) 140/80 (BP Location: Left upper arm, Patient Position: Sitting, BP Cuff Size: Large Adult)   Ht 154.9 cm (5\' 1" )   Wt 90 kg (198 lb 6.4 oz)   BMI 37.49 kg/m   Patient is a well-developed, well-nourished female in no acute distress. Patient has normal mood and affect. Patient is alert and oriented to person, place, and time.   HEENT: Atraumatic, normocephalic. Pupils equal and reactive to light. Extraocular motion intact. Noninjected sclera.  Cardiovascular: Regular rate and rhythm, with no murmurs, rubs, or gallops. Distal pulses palpable.  Respiratory: Lungs clear to auscultation bilaterally.   Right Hip: Pelvic tilt: Negative Limb lengths: Equal with the patient standing Soft tissue swelling: Negative Erythema: Negative Crepitance: Negative Tenderness: Greater trochanter is nontender to palpation. Moderate pain is elicited by axial compression or extremes of rotation. Atrophy: No atrophy. Good hip flexor and abductor strength. Range of Motion: EXT/FLEX: 0/0/100 ADD/ABD: 20/0/20 IR/ER: 10/0/20   Sensation is intact over the saphenous, lateral cutaneous,  superficial fibular, and deep fibular nerve distributions.  Tests Performed/Reviewed:  X-rays  Anteroposterior view of the pelvis as well as anteroposterior and lateral views of the right hip were obtained. Images reveal severe loss of femoroacetabular joint space with significant osteophyte formation. No fractures or osseous abnormality noted.   I personally ordered and interpreted today's x-rays.  Impression:   ICD-10-CM  1. Primary osteoarthritis of right hip M16.11   Plan:   The patient has end-stage degenerative changes of the right hip. It was explained to the patient that the condition is progressive in nature. Having failed conservative treatment, the patient has elected to proceed with a total joint arthroplasty. The patient will undergo a total joint arthroplasty with Dr. Marry Guan. The risks of surgery, including blood clot and infection, were discussed with the patient. Measures to reduce these risks, including the use of anticoagulation, perioperative antibiotics, and early ambulation were discussed. The importance of postoperative physical therapy was discussed with the patient. The patient elects to proceed with surgery. The patient is instructed to stop all blood thinners prior to surgery. The patient is instructed to call the hospital the day before surgery to learn of the proper arrival time.   Contact our office with any questions or concerns. Follow up as indicated, or sooner should any new problems arise, if conditions worsen, or if they are otherwise concerned.  Gina Fudge, PA-C [facility] [place],  [address] Phone: [phone]  This note was generated in part with voice recognition software and I apologize for any typographical errors that were not detected and corrected.  Electronically signed by Gina Fudge, PA at 10/24/2021 6:18 PM EST

## 2021-10-30 ENCOUNTER — Encounter: Payer: Self-pay | Admitting: Orthopedic Surgery

## 2021-10-30 ENCOUNTER — Ambulatory Visit: Payer: 59 | Admitting: Urgent Care

## 2021-10-30 ENCOUNTER — Observation Stay: Payer: 59

## 2021-10-30 ENCOUNTER — Observation Stay
Admission: RE | Admit: 2021-10-30 | Discharge: 2021-10-31 | Disposition: A | Discharge status: Home / Self Care | Payer: 59 | Attending: Orthopedic Surgery | Admitting: Orthopedic Surgery

## 2021-10-30 ENCOUNTER — Encounter
Admission: RE | Disposition: A | Discharge status: Home / Self Care | Payer: Self-pay | Source: Home / Self Care | Attending: Orthopedic Surgery

## 2021-10-30 ENCOUNTER — Other Ambulatory Visit: Payer: Self-pay

## 2021-10-30 DIAGNOSIS — Z79899 Other long term (current) drug therapy: Secondary | ICD-10-CM | POA: Diagnosis not present

## 2021-10-30 DIAGNOSIS — E559 Vitamin D deficiency, unspecified: Secondary | ICD-10-CM | POA: Insufficient documentation

## 2021-10-30 DIAGNOSIS — M1611 Unilateral primary osteoarthritis, right hip: Secondary | ICD-10-CM | POA: Diagnosis not present

## 2021-10-30 DIAGNOSIS — E785 Hyperlipidemia, unspecified: Secondary | ICD-10-CM | POA: Insufficient documentation

## 2021-10-30 DIAGNOSIS — Z96649 Presence of unspecified artificial hip joint: Secondary | ICD-10-CM

## 2021-10-30 DIAGNOSIS — Z96641 Presence of right artificial hip joint: Secondary | ICD-10-CM

## 2021-10-30 DIAGNOSIS — D649 Anemia, unspecified: Secondary | ICD-10-CM | POA: Insufficient documentation

## 2021-10-30 HISTORY — PX: TOTAL HIP ARTHROPLASTY: SHX124

## 2021-10-30 LAB — ABO/RH: ABO/RH(D): O POS

## 2021-10-30 SURGERY — ARTHROPLASTY, HIP, TOTAL,POSTERIOR APPROACH
Anesthesia: Spinal | Site: Hip | Laterality: Right

## 2021-10-30 MED ORDER — PROPOFOL 1000 MG/100ML IV EMUL
INTRAVENOUS | Status: AC
  Filled 2021-10-30: qty 100

## 2021-10-30 MED ORDER — LACTATED RINGERS IV SOLN: INTRAVENOUS | Status: DC

## 2021-10-30 MED ORDER — ACETAMINOPHEN 10 MG/ML IV SOLN
INTRAVENOUS | Status: AC
  Filled 2021-10-30: qty 100

## 2021-10-30 MED ORDER — KETOROLAC TROMETHAMINE 0.5 % OP SOLN
1.0000 [drp] | Freq: Every evening | OPHTHALMIC | Status: DC
  Filled 2021-10-30: qty 3

## 2021-10-30 MED ORDER — MENTHOL 3 MG MT LOZG
1.0000 | LOZENGE | OROMUCOSAL | Status: DC | PRN
  Filled 2021-10-30: qty 9

## 2021-10-30 MED ORDER — PHENYLEPHRINE HCL (PRESSORS) 10 MG/ML IV SOLN
INTRAVENOUS | Status: AC
  Filled 2021-10-30: qty 1

## 2021-10-30 MED ORDER — ENOXAPARIN SODIUM 30 MG/0.3ML IJ SOSY
30.0000 mg | PREFILLED_SYRINGE | Freq: Two times a day (BID) | INTRAMUSCULAR | Status: DC
  Administered 2021-10-31: 30 mg via SUBCUTANEOUS
  Filled 2021-10-30 (×2): qty 0.3

## 2021-10-30 MED ORDER — DEXAMETHASONE SODIUM PHOSPHATE 10 MG/ML IJ SOLN: 8.0000 mg | Freq: Once | INTRAMUSCULAR | Status: AC

## 2021-10-30 MED ORDER — PREDNISOLONE ACETATE 1 % OP SUSP
1.0000 [drp] | Freq: Four times a day (QID) | OPHTHALMIC | Status: DC
  Administered 2021-10-30 – 2021-10-31 (×3): 1 [drp] via OPHTHALMIC
  Filled 2021-10-30: qty 1

## 2021-10-30 MED ORDER — TRAMADOL HCL 50 MG PO TABS
ORAL_TABLET | ORAL | Status: AC
  Filled 2021-10-30: qty 1

## 2021-10-30 MED ORDER — GABAPENTIN 300 MG PO CAPS: 300.0000 mg | ORAL_CAPSULE | Freq: Once | ORAL | Status: AC

## 2021-10-30 MED ORDER — TRANEXAMIC ACID-NACL 1000-0.7 MG/100ML-% IV SOLN
INTRAVENOUS | Status: AC
  Filled 2021-10-30: qty 100

## 2021-10-30 MED ORDER — ONDANSETRON HCL 4 MG/2ML IJ SOLN
INTRAMUSCULAR | Status: DC | PRN
Start: 2021-10-30 — End: 2021-10-30
  Administered 2021-10-30: 4 mg via INTRAVENOUS

## 2021-10-30 MED ORDER — TRANEXAMIC ACID-NACL 1000-0.7 MG/100ML-% IV SOLN: 1000.0000 mg | Freq: Once | INTRAVENOUS | Status: AC

## 2021-10-30 MED ORDER — SENNOSIDES-DOCUSATE SODIUM 8.6-50 MG PO TABS
1.0000 | ORAL_TABLET | Freq: Two times a day (BID) | ORAL | Status: DC
  Administered 2021-10-30 – 2021-10-31 (×3): 1 via ORAL
  Filled 2021-10-30 (×4): qty 1

## 2021-10-30 MED ORDER — ALUM & MAG HYDROXIDE-SIMETH 200-200-20 MG/5ML PO SUSP: 30.0000 mL | ORAL | Status: DC | PRN

## 2021-10-30 MED ORDER — TRAMADOL HCL 50 MG PO TABS
ORAL_TABLET | ORAL | Status: AC
  Administered 2021-10-30: 50 mg via ORAL
  Filled 2021-10-30: qty 1

## 2021-10-30 MED ORDER — ORAL CARE MOUTH RINSE: 15.0000 mL | Freq: Once | OROMUCOSAL | Status: AC

## 2021-10-30 MED ORDER — METOCLOPRAMIDE HCL 10 MG PO TABS
ORAL_TABLET | ORAL | Status: AC
  Filled 2021-10-30: qty 1

## 2021-10-30 MED ORDER — PREDNISOLONE ACETATE 1 % OP SUSP
1.0000 [drp] | Freq: Four times a day (QID) | OPHTHALMIC | Status: DC
  Administered 2021-10-30: 1 [drp] via OPHTHALMIC
  Filled 2021-10-30: qty 1

## 2021-10-30 MED ORDER — BISACODYL 10 MG RE SUPP
10.0000 mg | Freq: Every day | RECTAL | Status: DC | PRN
  Filled 2021-10-30: qty 1

## 2021-10-30 MED ORDER — TRANEXAMIC ACID-NACL 1000-0.7 MG/100ML-% IV SOLN
1000.0000 mg | INTRAVENOUS | Status: AC
  Administered 2021-10-30: 1000 mg via INTRAVENOUS

## 2021-10-30 MED ORDER — OXYCODONE HCL 5 MG/5ML PO SOLN: 5.0000 mg | Freq: Once | ORAL | Status: DC | PRN

## 2021-10-30 MED ORDER — PRONTOSAN WOUND IRRIGATION OPTIME
TOPICAL | Status: DC | PRN
  Administered 2021-10-30: 1 via TOPICAL

## 2021-10-30 MED ORDER — CHLORHEXIDINE GLUCONATE 0.12 % MT SOLN
OROMUCOSAL | Status: AC
  Filled 2021-10-30: qty 15

## 2021-10-30 MED ORDER — FENTANYL CITRATE (PF) 100 MCG/2ML IJ SOLN
INTRAMUSCULAR | Status: DC | PRN
Start: 2021-10-30 — End: 2021-10-30
  Administered 2021-10-30 (×2): 50 ug via INTRAVENOUS

## 2021-10-30 MED ORDER — VITAMIN D3 25 MCG (1000 UNIT) PO TABS
1000.0000 [IU] | ORAL_TABLET | Freq: Every day | ORAL | Status: DC
  Administered 2021-10-30 – 2021-10-31 (×2): 1000 [IU] via ORAL
  Filled 2021-10-30 (×2): qty 1

## 2021-10-30 MED ORDER — PANTOPRAZOLE SODIUM 40 MG PO TBEC
DELAYED_RELEASE_TABLET | ORAL | Status: AC
  Filled 2021-10-30: qty 1

## 2021-10-30 MED ORDER — CELECOXIB 200 MG PO CAPS
ORAL_CAPSULE | ORAL | Status: AC
  Administered 2021-10-30: 400 mg via ORAL
  Filled 2021-10-30: qty 2

## 2021-10-30 MED ORDER — PROPOFOL 500 MG/50ML IV EMUL
INTRAVENOUS | Status: DC | PRN
  Administered 2021-10-30: 70 ug/kg/min via INTRAVENOUS

## 2021-10-30 MED ORDER — FAMOTIDINE 20 MG PO TABS: 20.0000 mg | ORAL_TABLET | Freq: Once | ORAL | Status: AC

## 2021-10-30 MED ORDER — TRAMADOL HCL 50 MG PO TABS
50.0000 mg | ORAL_TABLET | ORAL | Status: DC | PRN
  Administered 2021-10-30 – 2021-10-31 (×2): 50 mg via ORAL

## 2021-10-30 MED ORDER — SODIUM CHLORIDE 0.9 % IR SOLN
Status: DC | PRN
  Administered 2021-10-30: 3000 mL

## 2021-10-30 MED ORDER — MAGNESIUM HYDROXIDE 400 MG/5ML PO SUSP
ORAL | Status: AC
  Administered 2021-10-30: 30 mL via ORAL
  Filled 2021-10-30: qty 30

## 2021-10-30 MED ORDER — FERROUS SULFATE 325 (65 FE) MG PO TABS
325.0000 mg | ORAL_TABLET | Freq: Two times a day (BID) | ORAL | Status: DC
  Administered 2021-10-30 – 2021-10-31 (×2): 325 mg via ORAL
  Filled 2021-10-30 (×3): qty 1

## 2021-10-30 MED ORDER — PANTOPRAZOLE SODIUM 40 MG PO TBEC
DELAYED_RELEASE_TABLET | ORAL | Status: AC
  Administered 2021-10-30: 40 mg via ORAL
  Filled 2021-10-30: qty 1

## 2021-10-30 MED ORDER — ACETAMINOPHEN 10 MG/ML IV SOLN
INTRAVENOUS | Status: DC | PRN
  Administered 2021-10-30: 1000 mg via INTRAVENOUS

## 2021-10-30 MED ORDER — ACETAMINOPHEN 10 MG/ML IV SOLN
1000.0000 mg | Freq: Four times a day (QID) | INTRAVENOUS | Status: AC
  Administered 2021-10-30 – 2021-10-31 (×2): 1000 mg via INTRAVENOUS

## 2021-10-30 MED ORDER — NEOMYCIN-POLYMYXIN B GU 40-200000 IR SOLN
Status: DC | PRN
  Administered 2021-10-30: 14 mL

## 2021-10-30 MED ORDER — CHLORHEXIDINE GLUCONATE 4 % EX LIQD
60.0000 mL | Freq: Once | CUTANEOUS | Status: AC
  Administered 2021-10-30: 4 via TOPICAL

## 2021-10-30 MED ORDER — TRANEXAMIC ACID-NACL 1000-0.7 MG/100ML-% IV SOLN
INTRAVENOUS | Status: AC
  Administered 2021-10-30: 1000 mg via INTRAVENOUS
  Filled 2021-10-30: qty 100

## 2021-10-30 MED ORDER — METOCLOPRAMIDE HCL 10 MG PO TABS
10.0000 mg | ORAL_TABLET | Freq: Three times a day (TID) | ORAL | Status: DC
  Administered 2021-10-30: 10 mg via ORAL

## 2021-10-30 MED ORDER — PHENYLEPHRINE HCL-NACL 20-0.9 MG/250ML-% IV SOLN
INTRAVENOUS | Status: DC | PRN
  Administered 2021-10-30: 40 ug/min via INTRAVENOUS

## 2021-10-30 MED ORDER — BUPIVACAINE HCL (PF) 0.5 % IJ SOLN
INTRAMUSCULAR | Status: DC | PRN
  Administered 2021-10-30: 3 mL

## 2021-10-30 MED ORDER — OXYCODONE HCL 5 MG PO TABS: 5.0000 mg | ORAL_TABLET | Freq: Once | ORAL | Status: DC | PRN

## 2021-10-30 MED ORDER — MIDAZOLAM HCL 5 MG/5ML IJ SOLN
INTRAMUSCULAR | Status: DC | PRN
  Administered 2021-10-30 (×2): 1 mg via INTRAVENOUS

## 2021-10-30 MED ORDER — DIPHENHYDRAMINE HCL 12.5 MG/5ML PO ELIX
12.5000 mg | ORAL_SOLUTION | ORAL | Status: DC | PRN
  Filled 2021-10-30: qty 10

## 2021-10-30 MED ORDER — PHENYLEPHRINE HCL (PRESSORS) 10 MG/ML IV SOLN
INTRAVENOUS | Status: DC | PRN
  Administered 2021-10-30 (×3): 100 ug via INTRAVENOUS

## 2021-10-30 MED ORDER — SODIUM CHLORIDE 0.9 % IV SOLN: INTRAVENOUS | Status: DC

## 2021-10-30 MED ORDER — MAGNESIUM HYDROXIDE 400 MG/5ML PO SUSP: 30.0000 mL | Freq: Every day | ORAL | Status: DC

## 2021-10-30 MED ORDER — FENTANYL CITRATE (PF) 100 MCG/2ML IJ SOLN
INTRAMUSCULAR | Status: AC
  Filled 2021-10-30: qty 2

## 2021-10-30 MED ORDER — FLEET ENEMA 7-19 GM/118ML RE ENEM: 1.0000 | ENEMA | Freq: Once | RECTAL | Status: DC | PRN

## 2021-10-30 MED ORDER — CEFAZOLIN SODIUM-DEXTROSE 2-4 GM/100ML-% IV SOLN
2.0000 g | INTRAVENOUS | Status: AC
  Administered 2021-10-30: 2 g via INTRAVENOUS

## 2021-10-30 MED ORDER — PANTOPRAZOLE SODIUM 40 MG PO TBEC
40.0000 mg | DELAYED_RELEASE_TABLET | Freq: Two times a day (BID) | ORAL | Status: DC
Start: 2021-10-30 — End: 2021-10-31
  Administered 2021-10-30: 40 mg via ORAL

## 2021-10-30 MED ORDER — CEFAZOLIN SODIUM-DEXTROSE 2-4 GM/100ML-% IV SOLN
INTRAVENOUS | Status: AC
  Filled 2021-10-30: qty 100

## 2021-10-30 MED ORDER — FAMOTIDINE 20 MG PO TABS
ORAL_TABLET | ORAL | Status: AC
  Administered 2021-10-30: 20 mg via ORAL
  Filled 2021-10-30: qty 1

## 2021-10-30 MED ORDER — ACETAMINOPHEN 10 MG/ML IV SOLN
INTRAVENOUS | Status: AC
  Administered 2021-10-30: 1000 mg via INTRAVENOUS
  Filled 2021-10-30: qty 100

## 2021-10-30 MED ORDER — CHLORHEXIDINE GLUCONATE 0.12 % MT SOLN
15.0000 mL | Freq: Once | OROMUCOSAL | Status: AC
  Administered 2021-10-30: 15 mL via OROMUCOSAL

## 2021-10-30 MED ORDER — CELECOXIB 200 MG PO CAPS: 400.0000 mg | ORAL_CAPSULE | Freq: Once | ORAL | Status: AC

## 2021-10-30 MED ORDER — FUROSEMIDE 20 MG PO TABS
20.0000 mg | ORAL_TABLET | Freq: Every morning | ORAL | Status: DC
  Administered 2021-10-31: 20 mg via ORAL
  Filled 2021-10-30: qty 1

## 2021-10-30 MED ORDER — ATORVASTATIN CALCIUM 20 MG PO TABS
20.0000 mg | ORAL_TABLET | Freq: Every morning | ORAL | Status: DC
Start: 2021-10-31 — End: 2021-10-31
  Administered 2021-10-31: 20 mg via ORAL
  Filled 2021-10-30: qty 1

## 2021-10-30 MED ORDER — FENTANYL CITRATE (PF) 100 MCG/2ML IJ SOLN: 25.0000 ug | INTRAMUSCULAR | Status: DC | PRN

## 2021-10-30 MED ORDER — GABAPENTIN 300 MG PO CAPS
ORAL_CAPSULE | ORAL | Status: AC
  Administered 2021-10-30: 300 mg via ORAL
  Filled 2021-10-30: qty 1

## 2021-10-30 MED ORDER — METOCLOPRAMIDE HCL 10 MG PO TABS
ORAL_TABLET | ORAL | Status: AC
  Administered 2021-10-30: 10 mg via ORAL
  Filled 2021-10-30: qty 1

## 2021-10-30 MED ORDER — GATIFLOXACIN 0.5 % OP SOLN
1.0000 [drp] | Freq: Four times a day (QID) | OPHTHALMIC | Status: DC
  Filled 2021-10-30: qty 2.5

## 2021-10-30 MED ORDER — PHENOL 1.4 % MT LIQD
1.0000 | OROMUCOSAL | Status: DC | PRN
  Filled 2021-10-30: qty 177

## 2021-10-30 MED ORDER — DEXAMETHASONE SODIUM PHOSPHATE 10 MG/ML IJ SOLN
INTRAMUSCULAR | Status: AC
  Administered 2021-10-30: 8 mg via INTRAVENOUS
  Filled 2021-10-30: qty 1

## 2021-10-30 MED ORDER — MIDAZOLAM HCL 2 MG/2ML IJ SOLN
INTRAMUSCULAR | Status: AC
  Filled 2021-10-30: qty 2

## 2021-10-30 MED ORDER — 0.9 % SODIUM CHLORIDE (POUR BTL) OPTIME
TOPICAL | Status: DC | PRN
  Administered 2021-10-30: 500 mL

## 2021-10-30 SURGICAL SUPPLY — 59 items
BLADE DRUM FLTD (BLADE) ×2 IMPLANT
BLADE SAW 90X25X1.19 OSCILLAT (BLADE) ×2 IMPLANT
CARTRIDGE OIL MAESTRO DRILL (MISCELLANEOUS) ×1 IMPLANT
CUP ACETBLR 52 OD 100 SERIES (Hips) ×1 IMPLANT
DIFFUSER DRILL AIR PNEUMATIC (MISCELLANEOUS) ×2 IMPLANT
DRAPE 3/4 80X56 (DRAPES) ×2 IMPLANT
DRAPE INCISE IOBAN 66X60 STRL (DRAPES) ×2 IMPLANT
DRSG DERMACEA 8X12 NADH (GAUZE/BANDAGES/DRESSINGS) ×2 IMPLANT
DRSG MEPILEX SACRM 8.7X9.8 (GAUZE/BANDAGES/DRESSINGS) ×2 IMPLANT
DRSG OPSITE POSTOP 4X14 (GAUZE/BANDAGES/DRESSINGS) ×2 IMPLANT
DRSG TEGADERM 4X4.75 (GAUZE/BANDAGES/DRESSINGS) ×2 IMPLANT
DURAPREP 26ML APPLICATOR (WOUND CARE) ×2 IMPLANT
ELECT CAUTERY BLADE 6.4 (BLADE) ×2 IMPLANT
ELECT REM PT RETURN 9FT ADLT (ELECTROSURGICAL) ×2
ELECTRODE REM PT RTRN 9FT ADLT (ELECTROSURGICAL) ×1 IMPLANT
GLOVE SURG ENC TEXT LTX SZ7.5 (GLOVE) ×6 IMPLANT
GLOVE SURG UNDER LTX SZ8 (GLOVE) ×2 IMPLANT
GLOVE SURG UNDER POLY LF SZ7.5 (GLOVE) ×2 IMPLANT
GOWN STRL REUS W/ TWL LRG LVL3 (GOWN DISPOSABLE) ×2 IMPLANT
GOWN STRL REUS W/ TWL XL LVL3 (GOWN DISPOSABLE) ×1 IMPLANT
GOWN STRL REUS W/TWL LRG LVL3 (GOWN DISPOSABLE) ×2
GOWN STRL REUS W/TWL XL LVL3 (GOWN DISPOSABLE) ×2
HEAD M SROM 36MM PLUS 1.5 (Hips) IMPLANT
HEMOVAC 400CC 10FR (MISCELLANEOUS) ×2 IMPLANT
HOLDER FOLEY CATH W/STRAP (MISCELLANEOUS) ×2 IMPLANT
HOLSTER ELECTROSUGICAL PENCIL (MISCELLANEOUS) ×3 IMPLANT
IV NS IRRIG 3000ML ARTHROMATIC (IV SOLUTION) ×2 IMPLANT
KIT PEG BOARD PINK (KITS) ×2 IMPLANT
KIT TURNOVER KIT A (KITS) ×2 IMPLANT
LINER NEUTRAL 52X36MM PLUS 4 (Liner) ×1 IMPLANT
MANIFOLD NEPTUNE II (INSTRUMENTS) ×4 IMPLANT
NDL SAFETY ECLIPSE 18X1.5 (NEEDLE) ×1 IMPLANT
NEEDLE HYPO 18GX1.5 SHARP (NEEDLE) ×1
NS IRRIG 500ML POUR BTL (IV SOLUTION) ×2 IMPLANT
OIL CARTRIDGE MAESTRO DRILL (MISCELLANEOUS) ×2
PACK HIP PROSTHESIS (MISCELLANEOUS) ×2 IMPLANT
PENCIL SMOKE EVACUATOR COATED (MISCELLANEOUS) ×2 IMPLANT
PIN STEIN THRED 5/32 (Pin) ×2 IMPLANT
PULSAVAC PLUS IRRIG FAN TIP (DISPOSABLE) ×2
SOL PREP PVP 2OZ (MISCELLANEOUS) ×2
SOLUTION PREP PVP 2OZ (MISCELLANEOUS) ×1 IMPLANT
SOLUTION PRONTOSAN WOUND 350ML (IRRIGATION / IRRIGATOR) ×2 IMPLANT
SPONGE DRAIN TRACH 4X4 STRL 2S (GAUZE/BANDAGES/DRESSINGS) ×2 IMPLANT
SPONGE T-LAP 18X18 ~~LOC~~+RFID (SPONGE) ×9 IMPLANT
SROM M HEAD 36MM PLUS 1.5 (Hips) ×2 IMPLANT
STAPLER SKIN PROX 35W (STAPLE) ×2 IMPLANT
STEM FEM CMNTLSS LG AML 13.5 (Hips) ×1 IMPLANT
SUT ETHIBOND #5 BRAIDED 30INL (SUTURE) ×2 IMPLANT
SUT VIC AB 0 CT1 36 (SUTURE) ×2 IMPLANT
SUT VIC AB 1 CT1 36 (SUTURE) ×4 IMPLANT
SUT VIC AB 2-0 CT1 27 (SUTURE) ×1
SUT VIC AB 2-0 CT1 TAPERPNT 27 (SUTURE) ×1 IMPLANT
SYR 20ML LL LF (SYRINGE) ×2 IMPLANT
TAPE CLOTH 3X10 WHT NS LF (GAUZE/BANDAGES/DRESSINGS) ×2 IMPLANT
TAPE TRANSPORE STRL 2 31045 (GAUZE/BANDAGES/DRESSINGS) ×2 IMPLANT
TIP FAN IRRIG PULSAVAC PLUS (DISPOSABLE) ×1 IMPLANT
TOWEL OR 17X26 4PK STRL BLUE (TOWEL DISPOSABLE) ×2 IMPLANT
TRAY FOLEY MTR SLVR 16FR STAT (SET/KITS/TRAYS/PACK) ×2 IMPLANT
WATER STERILE IRR 1000ML POUR (IV SOLUTION) ×3 IMPLANT

## 2021-10-30 NOTE — Anesthesia Preprocedure Evaluation (Signed)
Anesthesia Evaluation  Patient identified by MRN, date of birth, ID band Patient awake    Reviewed: Allergy & Precautions, NPO status , Patient's Chart, lab work & pertinent test results  History of Anesthesia Complications Negative for: history of anesthetic complications  Airway Mallampati: III  TM Distance: >3 FB Neck ROM: full    Dental  (+) Chipped   Pulmonary neg pulmonary ROS, neg shortness of breath,    Pulmonary exam normal        Cardiovascular Exercise Tolerance: Good (-) angina(-) Past MI negative cardio ROS Normal cardiovascular exam     Neuro/Psych negative neurological ROS  negative psych ROS   GI/Hepatic negative GI ROS, Neg liver ROS, neg GERD  ,  Endo/Other  negative endocrine ROS  Renal/GU      Musculoskeletal   Abdominal   Peds  Hematology   Anesthesia Other Findings Past Medical History: No date: Anemia No date: Aortic atherosclerosis ([place]) No date: Aortic ectasia, abdominal (HCC) No date: Arthritis No date: Hyperlipidemia No date: Iron deficiency anemia No date: Osteoarthritis No date: Skin cancer     Comment:  nose No date: Vitamin D deficiency  Past Surgical History: No date: ABDOMINAL HYSTERECTOMY 06/23/2019: BREAST CYST ASPIRATION; Right     Comment:  neg 09/2021: CATARACT EXTRACTION W/ INTRAOCULAR LENS  IMPLANT, BILATERAL;  Bilateral 11/12/2005: COLONOSCOPY 02/10/2016: COLONOSCOPY WITH PROPOFOL; N/A     Comment:  Procedure: COLONOSCOPY WITH PROPOFOL;  Surgeon: Manya Silvas, MD;  Location: [facility] ENDOSCOPY;  Service:               Endoscopy;  Laterality: N/A; No date: EXPLORATORY LAPAROTOMY     Comment:  tubal pregnancy No date: RIGHT OOPHORECTOMY     Comment:  ovarian mass No date: TUBAL LIGATION 05/10/2017: VAGINAL PROLAPSE REPAIR     Comment:  and repair cystocele anterior; cystourethroscopy  BMI    Body Mass Index: 38.55 kg/m       Reproductive/Obstetrics negative OB ROS                             Anesthesia Physical Anesthesia Plan  ASA: 2  Anesthesia Plan: Spinal   Post-op Pain Management:    Induction:   PONV Risk Score and Plan:   Airway Management Planned: Natural Airway and Nasal Cannula  Additional Equipment:   Intra-op Plan:   Post-operative Plan:   Informed Consent: I have reviewed the patients History and Physical, chart, labs and discussed the procedure including the risks, benefits and alternatives for the proposed anesthesia with the patient or authorized representative who has indicated his/her understanding and acceptance.     Dental Advisory Given  Plan Discussed with: Anesthesiologist, CRNA and Surgeon  Anesthesia Plan Comments: (Patient reports no bleeding problems and no anticoagulant use.  Plan for spinal with backup GA  Patient consented for risks of anesthesia including but not limited to:  - adverse reactions to medications - damage to eyes, teeth, lips or other oral mucosa - nerve damage due to positioning  - risk of bleeding, infection and or nerve damage from spinal that could lead to paralysis - risk of headache or failed spinal - damage to teeth, lips or other oral mucosa - sore throat or hoarseness - damage to heart, brain, nerves, lungs, other parts of body or loss of life  Patient voiced understanding.)  Anesthesia Quick Evaluation

## 2021-10-30 NOTE — TOC Progression Note (Addendum)
Transition of Care [facility]) - Progression Note    Patient Details  Name: EMILIA KAYES MRN: [id number] Date of Birth: [birth year]  Transition of Care [facility]) CM/SW [place], RN Phone Number: 10/30/2021, 4:27 PM  Clinical Narrative:   Home health is set up with [facility], [employer] will deliver a RW to the bedside, to determine if needs a 3 in 1         Expected Discharge Plan and Services                                                 Social Determinants of Health (SDOH) Interventions    Readmission Risk Interventions No flowsheet data found.

## 2021-10-30 NOTE — Evaluation (Addendum)
Physical Therapy Evaluation Patient Details Name: Gina Jordan MRN: [id number] DOB: 1950-11-04 Today's Date: 10/30/2021  History of Present Illness  71 yr old femlae s/p R THA (10/30/21). PmHx: anemia, skin cancer, HLD.  Clinical Impression  Pt awake and alert, resting in bed w/ family at bedside, upon PT entrance into room for POD#0 evaluation. Pt is A&Ox4 and reports current pain is 8/10 at rest. Prior to surgery/hospitalization she was independent w/ all ADLs. She reports upon discharge she will be staying w/ her daughter's family in 1-story home w/ 3 steps to enter and right side railing. Daughter reports she will have access to either a tub-shower or walk-in shower upon discharge.  Pt performed Total Joint exercises in bed w/o any increase in pain levels. She was able to perform bed mobility w/ supervision and increased time due to pain level and reliance on bed rails. Once seated EOB she was able to perform sit to stand w/ CGA using RW. She required verbal cues for proper UE placement and utilization for overall effectiveness and safety. Once standing patient was able to ambulate ~31ft w/CGA and RW, before returning to recliner due to increased pain level. She was able to re-verbalize her posterior hip precautions at the end of the session and prior to PT exiting room. Pt will benefit from continued skilled PT in order to improve LE strength, mobility, gait, decrease c/o pain, and restore PLOF. Current discharge recommendation to HHPT appropriate due to the level of assistance required by the patient to ensure safety and improve overall function.      Recommendations for follow up therapy are one component of a multi-disciplinary discharge planning process, led by the attending physician.  Recommendations may be updated based on patient status, additional functional criteria and insurance authorization.  Follow Up Recommendations Home health PT    Assistance Recommended at Discharge Intermittent  Supervision/Assistance  Patient can return home with the following  A little help with walking and/or transfers;A little help with bathing/dressing/bathroom;Assistance with cooking/housework;Assist for transportation;Help with stairs or ramp for entrance    Equipment Recommendations Rolling walker (2 wheels)  Recommendations for Other Services       Functional Status Assessment Patient has had a recent decline in their functional status and demonstrates the ability to make significant improvements in function in a reasonable and predictable amount of time.     Precautions / Restrictions Precautions Precautions: Posterior Hip;Fall Precaution Booklet Issued: Yes (comment) Restrictions Weight Bearing Restrictions: Yes RLE Weight Bearing: Weight bearing as tolerated      Mobility  Bed Mobility Overal bed mobility: Needs Assistance Bed Mobility: Supine to Sit     Supine to sit: Supervision          Transfers Overall transfer level: Needs assistance Equipment used: Rolling walker (2 wheels) Transfers: Sit to/from Stand, Bed to chair/wheelchair/BSC Sit to Stand: Min guard   Step pivot transfers: Min guard       General transfer comment: verbal cues for proper UE utilization and safety    Ambulation/Gait Ambulation/Gait assistance: Min guard Gait Distance (Feet): 15 Feet Assistive device: Rolling walker (2 wheels) Gait Pattern/deviations: Step-to pattern, Decreased step length - right, Decreased step length - left, Decreased stride length Gait velocity: decreased        Stairs            Wheelchair Mobility    Modified Rankin (Stroke Patients Only)       Balance Overall balance assessment: Needs assistance Sitting-balance support: No upper  extremity supported, Feet unsupported Sitting balance-Leahy Scale: Good     Standing balance support: Single extremity supported, Bilateral upper extremity supported, During functional activity Standing  balance-Leahy Scale: Fair                               Pertinent Vitals/Pain Pain Assessment Pain Assessment: 0-10 Pain Score: 8  Pain Descriptors / Indicators: Aching, Discomfort, Dull, Sore Pain Intervention(s): Limited activity within patient's tolerance, Monitored during session, Repositioned, Ice applied, Patient requesting pain meds-RN notified    Home Living Family/patient expects to be discharged to:: Private residence Living Arrangements: Children Available Help at Discharge: Family;Friend(s) Type of Home: House Home Access: Stairs to enter Entrance Stairs-Rails: Right Entrance Stairs-Number of Steps: 3   Home Layout: One level Home Equipment: None      Prior Function Prior Level of Function : Independent/Modified Independent                     Hand Dominance        Extremity/Trunk Assessment   Upper Extremity Assessment Upper Extremity Assessment: Overall WFL for tasks assessed    Lower Extremity Assessment Lower Extremity Assessment: Overall WFL for tasks assessed       Communication      Cognition Arousal/Alertness: Awake/alert Behavior During Therapy: WFL for tasks assessed/performed Overall Cognitive Status: Within Functional Limits for tasks assessed                                          General Comments      Exercises Total Joint Exercises Ankle Circles/Pumps: AROM, Both, 10 reps Quad Sets: AROM, Both, 10 reps Hip ABduction/ADduction: AROM, Both, 10 reps   Assessment/Plan    PT Assessment Patient needs continued PT services  PT Problem List Decreased strength;Decreased mobility;Decreased coordination;Decreased range of motion;Decreased activity tolerance;Decreased balance;Pain;Decreased knowledge of use of DME;Decreased knowledge of precautions       PT Treatment Interventions DME instruction;Therapeutic exercise;Gait training;Balance training;Stair training;Functional mobility  training;Patient/family education;Therapeutic activities    PT Goals (Current goals can be found in the Care Plan section)  Acute Rehab PT Goals Patient Stated Goal: to decrease pain and go home PT Goal Formulation: With patient/family Time For Goal Achievement: 11/13/21 Potential to Achieve Goals: Good    Frequency BID     Co-evaluation               AM-PAC PT "6 Clicks" Mobility  Outcome Measure Help needed turning from your back to your side while in a flat bed without using bedrails?: A Little Help needed moving from lying on your back to sitting on the side of a flat bed without using bedrails?: A Little Help needed moving to and from a bed to a chair (including a wheelchair)?: A Little Help needed standing up from a chair using your arms (e.g., wheelchair or bedside chair)?: A Little Help needed to walk in hospital room?: A Little Help needed climbing 3-5 steps with a railing? : A Little 6 Click Score: 18    End of Session Equipment Utilized During Treatment: Gait belt Activity Tolerance: Patient tolerated treatment well;Patient limited by pain Patient left: in chair;with family/visitor present;with call bell/phone within reach Nurse Communication: Mobility status PT Visit Diagnosis: Unsteadiness on feet (R26.81);Pain;Muscle weakness (generalized) (M62.81);Other abnormalities of gait and mobility (R26.89) Pain - Right/Left:  Right Pain - part of body: Hip    Time: 0223-0257 PT Time Calculation (min) (ACUTE ONLY): 34 min   Charges:   PT Evaluation $PT Eval Low Complexity: 1 Low PT Treatments $Therapeutic Exercise: 8-22 mins $Therapeutic Activity: 8-22 mins       Jonnie Kind, SPT 10/30/2021, 3:45 PM

## 2021-10-30 NOTE — Anesthesia Procedure Notes (Signed)
Spinal  Patient location during procedure: OR Start time: 10/30/2021 7:19 AM Reason for block: surgical anesthesia Staffing Performed: resident/CRNA  Preanesthetic Checklist Completed: patient identified, IV checked, site marked, risks and benefits discussed, surgical consent, monitors and equipment checked, pre-op evaluation and timeout performed Spinal Block Patient position: sitting Prep: DuraPrep Patient monitoring: heart rate, cardiac monitor, continuous pulse ox and blood pressure Approach: midline Location: L3-4 Injection technique: single-shot Needle Needle type: Sprotte  Needle gauge: 24 G Needle length: 9 cm Assessment Sensory level: T4 Events: CSF return Additional Notes No pain with injection, negative paresthesia, negative heme, good free flow CSF pre/post injection.  Pt tolerated well.

## 2021-10-30 NOTE — Op Note (Signed)
OPERATIVE NOTE  DATE OF SURGERY:  10/30/2021  PATIENT NAME:  Gina Jordan   DOB: May 07, 1951  MRN: [id number]  PRE-OPERATIVE DIAGNOSIS: Degenerative arthrosis of the right hip, primary  POST-OPERATIVE DIAGNOSIS:  Same  PROCEDURE:  Right total hip arthroplasty  SURGEON:  Marciano Sequin. M.D.  ASSISTANT: Cassell Smiles, PA-C (present and scrubbed throughout the case, critical for assistance with exposure, retraction, instrumentation, and closure)  ANESTHESIA: spinal  ESTIMATED BLOOD LOSS: 100 mL  FLUIDS REPLACED: 1000 mL of crystalloid  DRAINS: Medium Hemovac drains  IMPLANTS UTILIZED: DePuy 13.5 mm large stature AML femoral stem, 52 mm OD Pinnacle 100 acetabular component, +4 mm neutral Pinnacle Altrx polyethylene insert, and a 36 mm M-SPEC +1.5 mm hip ball  INDICATIONS FOR SURGERY: Gina Jordan is a 71 y.o. year old female with a long history of progressive hip and groin  pain. X-rays demonstrated severe degenerative changes. The patient had not seen any significant improvement despite conservative nonsurgical intervention. After discussion of the risks and benefits of surgical intervention, the patient expressed understanding of the risks benefits and agree with plans for total hip arthroplasty.   The risks, benefits, and alternatives were discussed at length including but not limited to the risks of infection, bleeding, nerve injury, stiffness, blood clots, the need for revision surgery, limb length inequality, dislocation, cardiopulmonary complications, among others, and they were willing to proceed.  PROCEDURE IN DETAIL: The patient was brought into the operating room and, after adequate spinal anesthesia was achieved, the patient was placed in a left lateral decubitus position. Axillary roll was placed and all bony prominences were well-padded. The patient's right hip was cleaned and prepped with alcohol and DuraPrep and draped in the usual sterile fashion. A "timeout" was performed as  per usual protocol. A lateral curvilinear incision was made gently curving towards the posterior superior iliac spine. The IT band was incised in line with the skin incision and the fibers of the gluteus maximus were split in line. The piriformis tendon was identified, skeletonized, and incised at its insertion to the proximal femur and reflected posteriorly. A T type posterior capsulotomy was performed. Prior to dislocation of the femoral head, a threaded Steinmann pin was inserted through a separate stab incision into the pelvis superior to the acetabulum and bent in the form of a stylus so as to assess limb length and hip offset throughout the procedure. The femoral head was then dislocated posteriorly. Inspection of the femoral head demonstrated severe degenerative changes with full-thickness loss of articular cartilage. The femoral neck cut was performed using an oscillating saw. The anterior capsule was elevated off of the femoral neck using a periosteal elevator. Attention was then directed to the acetabulum. The remnant of the labrum was excised using electrocautery. Inspection of the acetabulum also demonstrated significant degenerative changes. The acetabulum was reamed in sequential fashion up to a 51 mm diameter. Good punctate bleeding bone was encountered. A 52 mm Pinnacle 100 acetabular component was positioned and impacted into place. Good scratch fit was appreciated. A +4 mm neutral polyethylene trial was inserted.  Attention was then directed to the proximal femur. A hole for reaming of the proximal femoral canal was created using a high-speed burr. The femoral canal was reamed in sequential fashion up to a 13 mm diameter. This allowed for approximately 6 cm of scratch fit. Serial broaches were inserted up to a 13.5 mm large stature femoral broach. Calcar region was planed and a trial reduction was performed using  a 36 mm hip ball with a +1.5 mm neck length. Good equalization of limb lengths and  hip offset was appreciated and excellent stability was noted both anteriorly and posteriorly. Trial components were removed. The acetabular shell was irrigated with copious amounts of normal saline with antibiotic solution and suctioned dry. A +4 mm Pinnacle Altrx polyethylene insert was positioned and impacted into place. Next, a 13.5 mm large stature AML femoral stem was positioned and impacted into place. Excellent scratch fit was appreciated. A trial reduction was again performed with a 36 mm hip ball with a +1.5 mm neck length. Again, good equalization of limb lengths was appreciated and excellent stability appreciated both anteriorly and posteriorly. The hip was then dislocated and the trial hip ball was removed. The Morse taper was cleaned and dried. A 36 mm M-SPEC hip ball with a +1.5 mm neck length was placed on the trunnion and impacted into place. The hip was then reduced and placed through range of motion. Excellent stability was appreciated both anteriorly and posteriorly.  The wound was irrigated with copious amounts of normal saline followed by 350 ml of Prontosan and suctioned dry. Good hemostasis was appreciated. The posterior capsulotomy was repaired using #5 Ethibond. Piriformis tendon was reapproximated to the undersurface of the gluteus medius tendon using #5 Ethibond. The IT band was reapproximated using interrupted sutures of #1 Vicryl. Subcutaneous tissue was approximated using first #0 Vicryl followed by #2-0 Vicryl. The skin was closed with skin staples.  The patient tolerated the procedure well and was transported to the recovery room in stable condition.   Marciano Sequin., M.D.

## 2021-10-30 NOTE — H&P (Signed)
The patient has been re-examined, and the chart reviewed, and there have been no interval changes to the documented history and physical.    The risks, benefits, and alternatives have been discussed at length. The patient expressed understanding of the risks benefits and agreed with plans for surgical intervention.  Tomer Chalmers P. Haskell Rihn, Jr. M.D.    

## 2021-10-30 NOTE — Transfer of Care (Signed)
Immediate Anesthesia Transfer of Care Note  Patient: [name]  Procedure(s) Performed: TOTAL HIP ARTHROPLASTY (Right: Hip)  Patient Location: PACU  Anesthesia Type:Spinal  Level of Consciousness: awake, drowsy and patient cooperative  Airway & Oxygen Therapy: Patient Spontanous Breathing  Post-op Assessment: Report given to RN and Post -op Vital signs reviewed and stable  Post vital signs: Reviewed and stable  Last Vitals:  Vitals Value Taken Time  BP 111/78 10/30/21 1114  Temp    Pulse 85 10/30/21 1116  Resp 13 10/30/21 1116  SpO2 94 % 10/30/21 1116  Vitals shown include unvalidated device data.  Last Pain:  Vitals:   10/30/21 0652  TempSrc: Temporal  PainSc: 0-No pain      Patients Stated Pain Goal: 0 (17/61/60 7371)  Complications: No notable events documented.

## 2021-10-31 ENCOUNTER — Encounter: Payer: Self-pay | Admitting: Orthopedic Surgery

## 2021-10-31 DIAGNOSIS — M1611 Unilateral primary osteoarthritis, right hip: Secondary | ICD-10-CM | POA: Diagnosis not present

## 2021-10-31 MED ORDER — MAGNESIUM HYDROXIDE 400 MG/5ML PO SUSP
ORAL | Status: AC
  Administered 2021-10-31: 30 mL via ORAL
  Filled 2021-10-31: qty 30

## 2021-10-31 MED ORDER — TRAMADOL HCL 50 MG PO TABS
ORAL_TABLET | ORAL | Status: AC
  Administered 2021-10-31: 50 mg via ORAL
  Filled 2021-10-31: qty 1

## 2021-10-31 MED ORDER — PANTOPRAZOLE SODIUM 40 MG PO TBEC
DELAYED_RELEASE_TABLET | ORAL | Status: AC
  Administered 2021-10-31: 40 mg via ORAL
  Filled 2021-10-31: qty 1

## 2021-10-31 MED ORDER — ACETAMINOPHEN 10 MG/ML IV SOLN
INTRAVENOUS | Status: AC
  Filled 2021-10-31: qty 100

## 2021-10-31 MED ORDER — ENOXAPARIN SODIUM 40 MG/0.4ML IJ SOSY: 40.0000 mg | PREFILLED_SYRINGE | INTRAMUSCULAR | 0 refills | Status: AC

## 2021-10-31 MED ORDER — METOCLOPRAMIDE HCL 10 MG PO TABS
ORAL_TABLET | ORAL | Status: AC
  Administered 2021-10-31: 10 mg via ORAL
  Filled 2021-10-31: qty 1

## 2021-10-31 MED ORDER — TRAMADOL HCL 50 MG PO TABS: 50.0000 mg | ORAL_TABLET | ORAL | 0 refills | Status: AC | PRN

## 2021-10-31 MED ORDER — TRAMADOL HCL 50 MG PO TABS
ORAL_TABLET | ORAL | Status: AC
  Filled 2021-10-31: qty 1

## 2021-10-31 NOTE — Care Management Obs Status (Signed)
[facility] NOTIFICATION   Patient Details  Name: Gina Jordan MRN: [id number] Date of Birth: [birth year]   Medicare Observation Status Notification Given:  Yes    Conception Oms, RN 10/31/2021, 10:00 AM

## 2021-10-31 NOTE — Progress Notes (Signed)
Physical Therapy Treatment Patient Details Name: Gina Jordan MRN: [id number] DOB: Aug 10, 1951 Today's Date: 10/31/2021   History of Present Illness 71 yr old femlae s/p R THA (10/30/21). PmHx: anemia, skin cancer, HLD.    PT Comments    Pt was awake and alert resting in bed w/ daughter and sister at bedside, upon PT entrance into room. Pt reports some pain this morning, but did not specify a number and reported RN just gave her some pain meds prior to session. Pt was able to perform bed mobility and sit to stand w/ SUPERVISION and use of RW. Once standing, patient was able to ambulate to rehab stairs w/ CGA and RW, before resting while PT provided patient education of stair performance in order to maintain precautions and safety. Pt expressed understanding and was able to perform 4 steps for 1st bout and 2 steps for 2nd bout. After stair performance she was able to ambulate back to her room w/ CGA and RW for a ~159ft total between both bouts. Pt was left in recliner w/ OT entering room for start of session. Pt will benefit from continued skilled in order to improve LE strength, mobility, gait, decrease c/o pain, and restore PLOF. Current discharge recommendation remains appropriate due to the level of assistance required by the patient to ensure safety and improve overall function.   Recommendations for follow up therapy are one component of a multi-disciplinary discharge planning process, led by the attending physician.  Recommendations may be updated based on patient status, additional functional criteria and insurance authorization.  Follow Up Recommendations  Home health PT     Assistance Recommended at Discharge Intermittent Supervision/Assistance  Patient can return home with the following A little help with walking and/or transfers;A little help with bathing/dressing/bathroom;Assistance with cooking/housework;Assist for transportation;Help with stairs or ramp for entrance   Equipment  Recommendations  Rolling walker (2 wheels)    Recommendations for Other Services       Precautions / Restrictions Precautions Precautions: Posterior Hip;Fall Precaution Booklet Issued: Yes (comment) Restrictions Weight Bearing Restrictions: Yes RLE Weight Bearing: Weight bearing as tolerated     Mobility  Bed Mobility Overal bed mobility: Needs Assistance Bed Mobility: Supine to Sit     Supine to sit: Supervision          Transfers Overall transfer level: Needs assistance Equipment used: Rolling walker (2 wheels) Transfers: Sit to/from Stand, Bed to chair/wheelchair/BSC Sit to Stand: Supervision           General transfer comment: Demonstrates good UE placement with RW use. Requires verbal cues for adhering to posterior hip precautions during transfer    Ambulation/Gait Ambulation/Gait assistance: Min guard Gait Distance (Feet): 160 Feet Assistive device: Rolling walker (2 wheels) Gait Pattern/deviations: Decreased step length - right, Decreased step length - left, Decreased stride length Gait velocity: decreased     General Gait Details: step-to pattern during initiation of ambulation, with progression ot step-through.   Stairs Stairs: Yes Stairs assistance: Min guard Stair Management: One rail Right Number of Stairs: 4 (4 steps for 1st bout; 2 steps for 2nd bout.)     Wheelchair Mobility    Modified Rankin (Stroke Patients Only)       Balance Overall balance assessment: Needs assistance Sitting-balance support: No upper extremity supported, Feet supported Sitting balance-Leahy Scale: Good     Standing balance support: Bilateral upper extremity supported, During functional activity Standing balance-Leahy Scale: Fair  Cognition Arousal/Alertness: Awake/alert Behavior During Therapy: WFL for tasks assessed/performed Overall Cognitive Status: Within Functional Limits for tasks assessed                                           Exercises      General Comments        Pertinent Vitals/Pain Pain Assessment Pain Assessment: Faces Faces Pain Scale: Hurts little more Pain Location: r hip Pain Descriptors / Indicators: Aching, Discomfort, Dull, Sore Pain Intervention(s): Limited activity within patient's tolerance, Monitored during session, Premedicated before session, Repositioned    Home Living Family/patient expects to be discharged to:: Private residence Living Arrangements: Children Available Help at Discharge: Family;Friend(s) Type of Home: House Home Access: Stairs to enter Entrance Stairs-Rails: Right Entrance Stairs-Number of Steps: 3   Home Layout: One level Home Equipment: Shower seat;Shower seat - built in;Tub bench      Prior Function            PT Goals (current goals can now be found in the care plan section) Progress towards PT goals: Progressing toward goals    Frequency    BID      PT Plan      Co-evaluation              AM-PAC PT "6 Clicks" Mobility   Outcome Measure  Help needed turning from your back to your side while in a flat bed without using bedrails?: A Little Help needed moving from lying on your back to sitting on the side of a flat bed without using bedrails?: A Little Help needed moving to and from a bed to a chair (including a wheelchair)?: A Little Help needed standing up from a chair using your arms (e.g., wheelchair or bedside chair)?: A Little Help needed to walk in hospital room?: A Little Help needed climbing 3-5 steps with a railing? : A Little 6 Click Score: 18    End of Session Equipment Utilized During Treatment: Gait belt Activity Tolerance: Patient tolerated treatment well;Patient limited by pain;Patient limited by fatigue Patient left: in chair;with family/visitor present;with call bell/phone within reach (w/ OT entering room) Nurse Communication: Mobility status PT Visit Diagnosis:  Unsteadiness on feet (R26.81);Pain;Muscle weakness (generalized) (M62.81);Other abnormalities of gait and mobility (R26.89) Pain - Right/Left: Right Pain - part of body: Hip     Time: 3762-8315 PT Time Calculation (min) (ACUTE ONLY): 32 min  Charges:                         Jonnie Kind, SPT 10/31/2021, 11:32 AM

## 2021-10-31 NOTE — Progress Notes (Signed)
Met with the patient to discuss DC plan and needs She needs a RW and 3 in 1, [employer] will deliver to the bedside, [facility] is set up for [place] reviewed and she stated understanding

## 2021-10-31 NOTE — Discharge Summary (Signed)
Physician Discharge Summary  Patient ID: Ladaja Yusupov Quinney MRN: [id number] DOB/AGE: Jun 12, 1951 71 y.o.  Admit date: 10/30/2021 Discharge date: 10/31/2021  Admission Diagnoses:  Hx of total hip arthroplasty, right [Z96.641]  Surgeries:Procedure(s): Right total hip arthroplasty   SURGEON:  Marciano Sequin. M.D.   ASSISTANT: Cassell Smiles, PA-C (present and scrubbed throughout the case, critical for assistance with exposure, retraction, instrumentation, and closure)   ANESTHESIA: spinal   ESTIMATED BLOOD LOSS: 100 mL   FLUIDS REPLACED: 1000 mL of crystalloid   DRAINS: Medium Hemovac drains   IMPLANTS UTILIZED: DePuy 13.5 mm large stature AML femoral stem, 52 mm OD Pinnacle 100 acetabular component, +4 mm neutral Pinnacle Altrx polyethylene insert, and a 36 mm M-SPEC +1.5 mm hip ball  Discharge Diagnoses: Patient Active Problem List   Diagnosis Date Noted   Anemia 10/30/2021   Hyperlipidemia 10/30/2021   Vitamin D deficiency 10/30/2021   Hx of total hip arthroplasty, right 10/30/2021   Aortic ectasia, 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 female who was admitted 10/30/2021 with a diagnosis of right hip osteoarthritis and went to the operating room on 10/30/2021 and underwent right total hip arthroplasty through posterior approach. The patient received perioperative antibiotics for prophylaxis (see below). The patient tolerated the procedure well and was transported to PACU in stable condition. After meeting PACU criteria, the patient was subsequently transferred to the Orthopaedics/Rehabilitation  unit.   The patient received DVT prophylaxis in the form of early mobilization, Lovenox, TED hose, and SCDs . A sacral pad had been placed and heels were elevated off of the bed with rolled towels in order to protect skin integrity. Foley catheter was discontinued on postoperative day #0. Wound drains were discontinued on postoperative day #1. The surgical incision was healing well without signs of infection.  Physical therapy was initiated postoperatively for transfers, gait training, and strengthening. Occupational therapy was initiated for activities of daily living and evaluation for assisted devices. Rehabilitation goals were reviewed in detail with the patient. The patient made steady progress with physical therapy and physical therapy recommended discharge to Home.   The patient achieved the preliminary goals of this hospitalization and was felt to be medically and orthopaedically appropriate for discharge.  She was given perioperative antibiotics:  Anti-infectives (From admission, onward)    Start     Dose/Rate Route Frequency Ordered Stop   10/30/21 0623  ceFAZolin (ANCEF) 2-4 GM/100ML-% IVPB       Note to Pharmacy: Register, Santiago Glad A: cabinet override      10/30/21 0623 10/30/21 0754   10/30/21 0600  ceFAZolin (ANCEF) IVPB 2g/100 mL premix        2 g 200 mL/hr over 30 Minutes Intravenous On call to O.R. 10/30/21 0000 10/30/21 0749     .  Recent vital signs:  Vitals:   10/31/21 0757 10/31/21 1200  BP: (!) 136/101 117/65  Pulse: 75 77  Resp: 20 18  Temp: (!) 97.3 F (36.3 C) 97.8 F (36.6 C)  SpO2: 94% 97%    Recent laboratory studies:  No results for input(s): WBC, HGB, HCT, PLT, K, CL, CO2, BUN, CREATININE, GLUCOSE, CALCIUM, LABPT, INR in the last 72 hours.  Diagnostic Studies: DG Hip Port Unilat With Pelvis 1V Right  Result Date: 10/30/2021 CLINICAL DATA:  Status post right total hip arthroplasty. EXAM: DG HIP (WITH OR WITHOUT PELVIS) 1V PORT RIGHT COMPARISON:  None.  FINDINGS: AP pelvis and cross-table lateral view of the right hip. Status post right total hip arthroplasty. A surgical drain and skin staples are in place. The hardware is well positioned. No evidence of acute fracture or dislocation. Small amount of surrounding soft tissue emphysema attributed to the procedure. IMPRESSION: No demonstrated complication following right total hip arthroplasty. Electronically Signed   By: Richardean Sale M.D.   On: 10/30/2021 11:43    Discharge Medications:   Allergies as of 10/31/2021   No Known Allergies      Medication List     TAKE these medications    acetaminophen 500 MG tablet Commonly known as: TYLENOL Take 500-1,000 mg by mouth every 6 (six) hours as needed (for pain.).   atorvastatin 20 MG tablet Commonly known as: LIPITOR Take 20 mg by mouth in the morning.   celecoxib 200 MG capsule Commonly known as: CELEBREX Take 200 mg by mouth in the morning.   Cholecalciferol 25 MCG (1000 UT) tablet Take 1,000 Units by mouth in the morning. Reported on 02/10/2016   enoxaparin 40 MG/0.4ML injection Commonly known as: LOVENOX Inject 0.4 mLs (40 mg total) into the skin daily for 14 days.   ferrous sulfate 325 (65 FE) MG tablet Take 325 mg by mouth every other day. In the morning   furosemide 20 MG tablet Commonly known as: LASIX Take 20 mg by mouth in the morning.   gatifloxacin 0.5 % Soln Commonly known as: ZYMAXID Place 1 drop into both eyes 4 (four) times daily.   prednisoLONE acetate 1 % ophthalmic suspension Commonly known as: PRED FORTE Place 1 drop into the right eye 4 (four) times daily.   Prolensa 0.07 % Soln Generic drug: Bromfenac Sodium Place 1 drop into both eyes every evening.   traMADol 50 MG tablet Commonly known as: ULTRAM Take 1 tablet (50 mg total) by mouth every 4 (four) hours as needed for moderate pain.               Durable Medical Equipment  (From admission, onward)           Start     Ordered    10/30/21 1256  DME Walker rolling  Once       Question:  Patient needs a walker to treat with the following condition  Answer:  S/P total hip arthroplasty   10/30/21 1256   10/30/21 1256  DME Bedside commode  Once       Question:  Patient needs a bedside commode to treat with the following condition  Answer:  S/P total hip arthroplasty   10/30/21 1256            Disposition: Home with home health PT     Follow-up Information     Hooten, Laurice Record, MD Follow up on 12/12/2021.   Specialty: Orthopedic Surgery Why: at 2:15pm Contact information: [place] [id number] [place], PA-C 10/31/2021, 12:53 PM

## 2021-10-31 NOTE — Evaluation (Signed)
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 PmHx: anemia, skin cancer, HLD.   Clinical Impression   Pt seen for OT evaluation this date, POD#1 from above surgery. Pt was independent in all ADLs/IADLs and functional mobility prior to surgery. Pt is eager to return to PLOF with less pain and improved safety and independence. Pt was able to recall 2/3 posterior total hip precautions at start of session and was unable to verbalize how to implement during ADL and mobility. Pt instructed in posterior total hip precautions and how to implement, self care skills, falls prevention strategies, home/routines modifications, and DME/AE for LB bathing and dressing tasks. Handout provided and pt and family verbalized understanding. Pt currently requires minimal assist for donning/doffing socks with AD while in seated position due to pain and limited AROM of R hip. Pt also requires MIN GUARD for toilet transfers/hygiene. At end of session, pt able to recall 3/3 posterior total hip precautions. Pt would benefit from additional instruction in self care skills and techniques to help maintain precautions with or without assistive devices to support recall and carryover prior to discharge. Recommend HHOT upon discharge.        Recommendations for follow up therapy are one component of a multi-disciplinary discharge planning process, led by the attending physician.  Recommendations may be updated based on patient status, additional functional criteria and insurance authorization.   Follow Up Recommendations  Home health OT    Assistance Recommended at Discharge Intermittent Supervision/Assistance  Patient can return home with the following A little help with walking and/or transfers;A little help with bathing/dressing/bathroom    Functional Status Assessment  Patient has had  a recent decline in their functional status and demonstrates the ability to make significant improvements in function in a reasonable and predictable amount of time.  Equipment Recommendations  BSC/3in1       Precautions / Restrictions Precautions Precautions: Posterior Hip;Fall Restrictions Weight Bearing Restrictions: Yes RLE Weight Bearing: Weight bearing as tolerated      Mobility Bed Mobility               General bed mobility comments: not assessed, in recliner pre/post session    Transfers Overall transfer level: Needs assistance Equipment used: Rolling walker (2 wheels) Transfers: Sit to/from Stand, Bed to chair/wheelchair/BSC Sit to Stand: Min guard           General transfer comment: Demonstrates good UE placement with RW use. Requires verbal cues for adhering to posterior hip precautions during transfer      Balance Overall balance assessment: Needs assistance Sitting-balance support: No upper extremity supported, Feet unsupported Sitting balance-Leahy Scale: Good Sitting balance - Comments: Good sitting balance reaching within BOS   Standing balance support: Bilateral upper extremity supported, During functional activity Standing balance-Leahy Scale: Fair Standing balance comment: Requires MIN GUARD for functional mobility of short household distances with RW                           ADL either performed or assessed with clinical judgement   ADL Overall ADL's : Needs assistance/impaired                     Lower Body Dressing: Minimal assistance;Sitting/lateral leans;With adaptive equipment;Adhering to hip precautions Lower Body Dressing Details (indicate cue type and reason): to don/doff socks  with sock aide and reacher Toilet Transfer: Min guard;Ambulation;BSC/3in1;Rolling walker (2 wheels)   Toileting- Clothing Manipulation and Hygiene: Min guard;Sit to/from stand       Functional mobility during ADLs: Min guard;Rolling  walker (2 wheels)       Vision Baseline Vision/History: 1 Wears glasses Ability to See in Adequate Light: 0 Adequate Patient Visual Report: No change from baseline              Pertinent Vitals/Pain Pain Assessment Pain Assessment: Faces Faces Pain Scale: Hurts even more Pain Location: r hip Pain Descriptors / Indicators: Aching, Discomfort, Dull, Sore Pain Intervention(s): Limited activity within patient's tolerance, Monitored during session, Patient requesting pain meds-RN notified        Extremity/Trunk Assessment Upper Extremity Assessment Upper Extremity Assessment: Overall WFL for tasks assessed   Lower Extremity Assessment Lower Extremity Assessment: RLE deficits/detail RLE Deficits / Details: s/p R THA       Communication Communication Communication: No difficulties   Cognition Arousal/Alertness: Awake/alert Behavior During Therapy: WFL for tasks assessed/performed Overall Cognitive Status: Within Functional Limits for tasks assessed                                          Exercises Other Exercises Other Exercises: Pt instructed in posterior total hip precautions and how to implement, self care skills, falls prevention strategies, home/routines modifications, and DME/AE for LB bathing and dressing tasks. Handout provided. Pt and family verbalized understanding        Home Living Family/patient expects to be discharged to:: Private residence Living Arrangements: Children Available Help at Discharge: Family;Friend(s) Type of Home: House Home Access: Stairs to enter [employer] of Steps: 3 Entrance Stairs-Rails: Right Home Layout: One level     Bathroom Shower/Tub: Tub/shower unit;Walk-in shower   Bathroom Toilet: Handicapped height Bathroom Accessibility: Yes   Home Equipment: Shower seat;Shower seat - built in;Tub bench          Prior Functioning/Environment Prior Level of Function : Independent/Modified  Independent             Mobility Comments: Independent without AD ADLs Comments: Independent with ADLs and IADLs. Pt works and drives        OT Problem List: Decreased activity tolerance;Impaired balance (sitting and/or standing);Pain      OT Treatment/Interventions: Self-care/ADL training;Therapeutic exercise;Energy conservation;DME and/or AE instruction;Therapeutic activities;Patient/family education;Balance training    OT Goals(Current goals can be found in the care plan section) Acute Rehab OT Goals Patient Stated Goal: to regain independence OT Goal Formulation: With patient Time For Goal Achievement: 11/14/21 Potential to Achieve Goals: Good ADL Goals Pt Will Perform Grooming: with modified independence;standing Pt Will Perform Lower Body Dressing: with modified independence;with adaptive equipment;sit to/from stand Pt Will Perform Toileting - Clothing Manipulation and hygiene: with modified independence;sit to/from stand  OT Frequency: Min 2X/week       AM-PAC OT "6 Clicks" Daily Activity     Outcome Measure Help from another person eating meals?: None Help from another person taking care of personal grooming?: None Help from another person toileting, which includes using toliet, bedpan, or urinal?: A Little Help from another person bathing (including washing, rinsing, drying)?: A Lot Help from another person to put on and taking off regular upper body clothing?: None Help from another person to put on and taking off regular lower body clothing?: A Little 6 Click Score: 20  End of Session Equipment Utilized During Treatment: Rolling walker (2 wheels) Nurse Communication: Mobility status  Activity Tolerance: Patient tolerated treatment well Patient left: in chair;with call bell/phone within reach;with nursing/sitter in room;with family/visitor present  OT Visit Diagnosis: Unsteadiness on feet (R26.81);Pain Pain - Right/Left: Right Pain - part of body: Hip                 Time: 2248-2500 OT Time Calculation (min): 28 min Charges:  OT General Charges $OT Visit: 1 Visit OT Evaluation $OT Eval Moderate Complexity: 1 Mod OT Treatments $Self Care/Home Management : 8-22 mins  Fredirick Maudlin, OTR/L [employer]

## 2021-10-31 NOTE — Progress Notes (Signed)
Patient needed to void. Got her up to bedside commode. Patient did have a sharpe shooting pain that did not last but a few seconds. After voiding patient back to bed. Pain medication administered.

## 2021-10-31 NOTE — Progress Notes (Signed)
°  Subjective: 1 Day Post-Op Procedure(s) (LRB): TOTAL HIP ARTHROPLASTY (Right) Patient reports pain as well-controlled.   Patient is well, and has had no acute complaints or problems Plan is to go Home after hospital stay. Negative for chest pain and shortness of breath Fever: no Gastrointestinal: negative for nausea and vomiting.  Patient has not had a bowel movement.  Objective: Vital signs in last 24 hours: Temp:  [96.8 F (36 C)-99.7 F (37.6 C)] 97.3 F (36.3 C) (02/07 0757) Pulse Rate:  [63-88] 75 (02/07 0757) Resp:  [10-22] 20 (02/07 0757) BP: (105-136)/(61-106) 136/101 (02/07 0757) SpO2:  [94 %-100 %] 94 % (02/07 0757)  Intake/Output from previous day:  Intake/Output Summary (Last 24 hours) at 10/31/2021 0821 Last data filed at 10/31/2021 0420 Gross per 24 hour  Intake 1400 ml  Output 1895 ml  Net -495 ml    Intake/Output this shift: No intake/output data recorded.  Labs: No results for input(s): HGB in the last 72 hours. No results for input(s): WBC, RBC, HCT, PLT in the last 72 hours. No results for input(s): NA, K, CL, CO2, BUN, CREATININE, GLUCOSE, CALCIUM in the last 72 hours. No results for input(s): LABPT, INR in the last 72 hours.   EXAM General - Patient is Alert, Appropriate, and Oriented Extremity - Neurovascular intact Dorsiflexion/Plantar flexion intact Compartment soft Dressing/Incision -clean, dry, no drainage, Hemovac in place.  Motor Function - intact, moving foot and toes well on exam.  Cardiovascular- Regular rate and rhythm, no murmurs/rubs/gallops Respiratory- Lungs clear to auscultation bilaterally Gastrointestinal- soft, nontender, and active bowel sounds   Assessment/Plan: 1 Day Post-Op Procedure(s) (LRB): TOTAL HIP ARTHROPLASTY (Right) Principal Problem:   Hx of total hip arthroplasty, right  Estimated body mass index is 38.55 kg/m as calculated from the following:   Height as of this encounter: 5\' 1"  (1.549 m).   Weight as of  this encounter: 92.5 kg. Advance diet Up with therapy  Anticipate d/c later this PM pending completion of therapy goals   Hemovac removed. and Mini compression dressing applied.   DVT Prophylaxis - Lovenox, Ted hose, and SCDs Weight-Bearing as tolerated to right leg  Cassell Smiles, PA-C [facility] Orthopaedic Surgery 10/31/2021, 8:21 AM

## 2021-10-31 NOTE — Anesthesia Postprocedure Evaluation (Signed)
Anesthesia Post Note  Patient: Gina Jordan  Procedure(s) Performed: TOTAL HIP ARTHROPLASTY (Right: Hip)  Patient location during evaluation: Nursing Unit Anesthesia Type: Spinal Level of consciousness: awake and alert Respiratory status: spontaneous breathing Cardiovascular status: stable Postop Assessment: no headache Anesthetic complications: no   No notable events documented.   Last Vitals:  Vitals:   10/31/21 0000 10/31/21 0400  BP: 105/61 130/83  Pulse: 83 72  Resp: 17 17  Temp: 37.6 C 36.9 C  SpO2: 94% 96%    Last Pain:  Vitals:   10/31/21 0458  TempSrc:   PainSc: 4                  Lerry Liner

## 2021-11-01 LAB — SURGICAL PATHOLOGY

## 2021-12-14 ENCOUNTER — Other Ambulatory Visit: Payer: Self-pay | Admitting: Internal Medicine

## 2021-12-14 DIAGNOSIS — Z1231 Encounter for screening mammogram for malignant neoplasm of breast: Secondary | ICD-10-CM

## 2022-01-22 ENCOUNTER — Ambulatory Visit
Admission: RE | Admit: 2022-01-22 | Discharge: 2022-01-22 | Disposition: A | Discharge status: Home / Self Care | Payer: 59 | Source: Ambulatory Visit | Attending: Internal Medicine | Admitting: Internal Medicine

## 2022-01-22 DIAGNOSIS — Z1231 Encounter for screening mammogram for malignant neoplasm of breast: Secondary | ICD-10-CM | POA: Insufficient documentation

## 2022-01-26 ENCOUNTER — Ambulatory Visit
Admission: RE | Admit: 2022-01-26 | Discharge: 2022-01-26 | Disposition: A | Discharge status: Home / Self Care | Payer: 59 | Source: Ambulatory Visit | Attending: Internal Medicine | Admitting: Internal Medicine

## 2022-01-26 ENCOUNTER — Other Ambulatory Visit: Payer: Self-pay | Admitting: Internal Medicine

## 2022-01-26 DIAGNOSIS — R928 Other abnormal and inconclusive findings on diagnostic imaging of breast: Secondary | ICD-10-CM | POA: Diagnosis present

## 2022-01-26 DIAGNOSIS — N63 Unspecified lump in unspecified breast: Secondary | ICD-10-CM

## 2023-02-13 ENCOUNTER — Other Ambulatory Visit: Payer: Self-pay | Admitting: Internal Medicine

## 2023-02-13 DIAGNOSIS — Z1231 Encounter for screening mammogram for malignant neoplasm of breast: Secondary | ICD-10-CM

## 2023-02-27 ENCOUNTER — Ambulatory Visit
Admission: RE | Admit: 2023-02-27 | Discharge: 2023-02-27 | Disposition: A | Discharge status: Home / Self Care | Payer: 59 | Source: Ambulatory Visit | Attending: Internal Medicine | Admitting: Internal Medicine

## 2023-02-27 DIAGNOSIS — Z1231 Encounter for screening mammogram for malignant neoplasm of breast: Secondary | ICD-10-CM | POA: Insufficient documentation

## 2024-01-27 ENCOUNTER — Other Ambulatory Visit: Payer: Self-pay | Admitting: Internal Medicine

## 2024-01-27 DIAGNOSIS — Z1231 Encounter for screening mammogram for malignant neoplasm of breast: Secondary | ICD-10-CM

## 2024-02-28 ENCOUNTER — Ambulatory Visit
Admission: RE | Admit: 2024-02-28 | Discharge: 2024-02-28 | Disposition: A | Discharge status: Home / Self Care | Source: Ambulatory Visit | Attending: Internal Medicine | Admitting: Internal Medicine

## 2024-02-28 DIAGNOSIS — Z1231 Encounter for screening mammogram for malignant neoplasm of breast: Secondary | ICD-10-CM | POA: Insufficient documentation
